# Patient Record
Sex: Female | Born: 1990 | Race: White | Hispanic: No | Marital: Single | Smoking: Current every day smoker
Health system: Southern US, Community
[De-identification: ages and names within clinical notes are randomized; demographics above are authoritative.]

## PROBLEM LIST (undated history)

## (undated) DIAGNOSIS — I38 Endocarditis, valve unspecified: Secondary | ICD-10-CM

## (undated) DIAGNOSIS — D649 Anemia, unspecified: Secondary | ICD-10-CM

## (undated) DIAGNOSIS — I509 Heart failure, unspecified: Secondary | ICD-10-CM

## (undated) DIAGNOSIS — R51 Headache: Secondary | ICD-10-CM

## (undated) DIAGNOSIS — N186 End stage renal disease: Secondary | ICD-10-CM

## (undated) DIAGNOSIS — N028 Recurrent and persistent hematuria with other morphologic changes: Secondary | ICD-10-CM

## (undated) DIAGNOSIS — R1310 Dysphagia, unspecified: Secondary | ICD-10-CM

## (undated) DIAGNOSIS — Z9981 Dependence on supplemental oxygen: Secondary | ICD-10-CM

## (undated) DIAGNOSIS — Z992 Dependence on renal dialysis: Secondary | ICD-10-CM

## (undated) DIAGNOSIS — G894 Chronic pain syndrome: Secondary | ICD-10-CM

## (undated) DIAGNOSIS — R768 Other specified abnormal immunological findings in serum: Secondary | ICD-10-CM

## (undated) DIAGNOSIS — Z7901 Long term (current) use of anticoagulants: Secondary | ICD-10-CM

## (undated) DIAGNOSIS — E781 Pure hyperglyceridemia: Secondary | ICD-10-CM

## (undated) DIAGNOSIS — R188 Other ascites: Secondary | ICD-10-CM

## (undated) DIAGNOSIS — E669 Obesity, unspecified: Secondary | ICD-10-CM

## (undated) DIAGNOSIS — N02B9 Other recurrent and persistent immunoglobulin A nephropathy: Secondary | ICD-10-CM

## (undated) DIAGNOSIS — F1990 Other psychoactive substance use, unspecified, uncomplicated: Secondary | ICD-10-CM

## (undated) DIAGNOSIS — R519 Headache, unspecified: Secondary | ICD-10-CM

## (undated) DIAGNOSIS — R7689 Other specified abnormal immunological findings in serum: Secondary | ICD-10-CM

## (undated) DIAGNOSIS — F419 Anxiety disorder, unspecified: Secondary | ICD-10-CM

## (undated) DIAGNOSIS — I071 Rheumatic tricuspid insufficiency: Secondary | ICD-10-CM

## (undated) DIAGNOSIS — Z79899 Other long term (current) drug therapy: Secondary | ICD-10-CM

## (undated) DIAGNOSIS — R609 Edema, unspecified: Secondary | ICD-10-CM

## (undated) DIAGNOSIS — R7881 Bacteremia: Secondary | ICD-10-CM

## (undated) DIAGNOSIS — I10 Essential (primary) hypertension: Secondary | ICD-10-CM

## (undated) HISTORY — DX: Obesity, unspecified: E66.9

## (undated) HISTORY — DX: Dependence on renal dialysis: Z99.2

## (undated) HISTORY — DX: Other recurrent and persistent immunoglobulin A nephropathy: N02.B9

## (undated) HISTORY — DX: Anemia, unspecified: D64.9

## (undated) HISTORY — DX: Essential (primary) hypertension: I10

## (undated) HISTORY — DX: Other specified abnormal immunological findings in serum: R76.8

## (undated) HISTORY — DX: Other specified abnormal immunological findings in serum: R76.89

## (undated) HISTORY — DX: Pure hyperglyceridemia: E78.1

## (undated) HISTORY — DX: Recurrent and persistent hematuria with other morphologic changes: N02.8

## (undated) HISTORY — PX: MITRAL VALVE REPLACEMENT: SHX147

---

## 2011-11-25 ENCOUNTER — Encounter: Payer: Self-pay | Admitting: Vascular Surgery

## 2011-11-26 ENCOUNTER — Ambulatory Visit: Payer: Self-pay | Admitting: Vascular Surgery

## 2011-11-26 ENCOUNTER — Encounter: Payer: Self-pay | Admitting: Vascular Surgery

## 2011-11-27 ENCOUNTER — Ambulatory Visit: Payer: Self-pay | Admitting: Vascular Surgery

## 2011-12-02 ENCOUNTER — Ambulatory Visit: Payer: Self-pay | Admitting: Vascular Surgery

## 2011-12-02 LAB — HCG, QUANTITATIVE, PREGNANCY: Beta Hcg, Quant.: <1 m[IU]/mL — ABNORMAL LOW

## 2011-12-02 LAB — POTASSIUM: Potassium: 4.4 mmol/L (ref 3.5–5.1)

## 2011-12-11 ENCOUNTER — Ambulatory Visit: Payer: Self-pay | Admitting: Vascular Surgery

## 2011-12-11 DIAGNOSIS — I10 Essential (primary) hypertension: Secondary | ICD-10-CM

## 2011-12-11 LAB — BASIC METABOLIC PANEL
Calcium, Total: 9.5 mg/dL (ref 8.5–10.1)
Co2: 26 mmol/L (ref 21–32)
Creatinine: 6.62 mg/dL — ABNORMAL HIGH (ref 0.60–1.30)
EGFR (African American): 9 — ABNORMAL LOW
EGFR (Non-African Amer.): 8 — ABNORMAL LOW
Glucose: 80 mg/dL (ref 65–99)
Osmolality: 283 (ref 275–301)
Sodium: 140 mmol/L (ref 136–145)

## 2011-12-11 LAB — CBC
MCH: 32.8 pg (ref 26.0–34.0)
MCHC: 33.8 g/dL (ref 32.0–36.0)
Platelet: 354 10*3/uL (ref 150–440)
RBC: 3.68 10*6/uL — ABNORMAL LOW (ref 3.80–5.20)
RDW: 14.1 % (ref 11.5–14.5)

## 2011-12-11 LAB — PREGNANCY, URINE: Pregnancy Test, Urine: NEGATIVE m[IU]/mL

## 2012-01-09 ENCOUNTER — Ambulatory Visit: Payer: Self-pay | Admitting: Vascular Surgery

## 2014-09-06 NOTE — Op Note (Signed)
PATIENT NAME:  Marilyn Hanson, Marilyn Hanson MR#:  [id number] DATE OF BIRTH:  1990/11/06  DATE OF PROCEDURE:  12/11/2011  PREOPERATIVE DIAGNOSES:  1. End-stage renal disease.  2. Hypertension.   POSTOPERATIVE DIAGNOSES:  1. End-stage renal disease.  2. Hypertension.   PROCEDURE PERFORMED: Laparoscopic insertion of peritoneal dialysis catheter.   SURGEON: Annice NeedyJason S. Simrit Gohlke, M.D.   ANESTHESIA: General.   ESTIMATED BLOOD LOSS: Minimal.   INDICATION FOR PROCEDURE: This is a 24 year old white female with end-stage renal disease. She has elected to proceed with peritoneal dialysis. Risks and benefits were discussed for peritoneal dialysis catheter and informed consent was obtained.   DESCRIPTION OF PROCEDURE: The patient was brought to the operative suite and after an adequate level of general anesthesia was obtained the abdomen was sterilely prepped and draped and a sterile surgical field was created. A small transverse incision was created to left of the umbilicus and we dissected down to the fascia, a pursestring suture was created with a 2-0 Vicryl suture, and I then created a transverse incision in the right upper quadrant and placed an Optiview 10 mm trocar and under direct visualization entering the abdomen insufflating this with carbon dioxide. The swan-neck peritoneal dialysis catheter was then placed into the pelvis through the middle of the pursestring in the fascia. It was parked into the pelvis. The cuff was secured to the fascia with a 2-0 Vicryl suture and a second 2-0 Vicryl pursestring suture was placed. The catheter was then brought out in the left lower quadrant with a separate stab incision with the second cuff being between the two incisions. The abdomen was desufflated of carbon dioxide. We instilled approximately 500 mL of sterile saline and 200 to 250 mL of saline immediately returned. We then visualized the catheter in the pelvis on removal of the laparoscope, with the abdomen desufflated, removing  the 10 mm trocar. Both incisions were closed with 3-0 Vicryl and a 4-0 Monocryl. Dermabond was placed over these dressing  and a dry dressing was placed around the catheter exit site. Please note the titanium connector for the peritoneal dialysis catheter from the company [employer] was used. ____________________________ Annice NeedyJason S. Berdia Lachman, MD jsd:slb D: 12/11/2011 15:46:59 ET T: 12/11/2011 15:59:35 ET JOB#: [id number]  cc: Annice NeedyJason S. Andre Gallego, MD, <Dictator> Salomon MastBelayenh Befekadu, MD Annice NeedyJASON S Carynn Felling MD ELECTRONICALLY SIGNED 12/12/2011 10:59

## 2014-09-06 NOTE — Op Note (Signed)
PATIENT NAME:  Marilyn Hanson, Marilyn Hanson MR#:  [id number] DATE OF BIRTH:  1991/02/09  DATE OF PROCEDURE:  12/02/2011  PREOPERATIVE DIAGNOSES:  1. End-stage renal disease.  2. Occluded left arm AV fistula.  3. Hypertension.   POSTOPERATIVE DIAGNOSES:    1. End-stage renal disease.  2. Occluded left arm AV fistula.  3. Hypertension.   PROCEDURES:  1. Ultrasound guidance for vascular access to left radiocephalic AV fistula.  2. Left upper extremity fistulogram.  3. Percutaneous transluminal angioplasty of left radiocephalic anastomosis with 2 and 4 mm diameter angioplasty balloon.   SURGEON: Annice Needy, M.D.   ANESTHESIA: Local with moderate conscious sedation.   ESTIMATED BLOOD LOSS: 25 mL. CONTRAST USED: 35 mL of Visipaque. FLUOROSCOPY TIME:  Approximately 10 minutes.   INDICATION FOR PROCEDURE: This is a 24 year old white female who is reasonably new to dialysis. She is already on her second PermCath. She had a left radiocephalic AV fistula created at [facility] about a month ago. This has been occluded for approximately one and one-half to two weeks. I had discussed with her in the office and she had desired to undergo peritoneal dialysis. Her dialysis center then contacted our office several days later and requested an attempt at declotting the fistula. It was discussed with the patient that this was unlikely to be usable before her peritoneal dialysis would be usable, but if that was her and their desire, we would attempt to do so although it had been occluded for some time. Informed consent was obtained.   DESCRIPTION OF PROCEDURE: The patient was brought to the vascular interventional radiology suite. Left upper extremity was sterilely prepped and draped and a sterile surgical field was created. The cephalic vein in the mid forearm was accessed in a retrograde fashion under direct ultrasound guidance with a micropuncture needle. Micropuncture wire and sheath were then placed. I upsized to a 6 [place]  sheath. Imaging showed a clear occlusion of the fistula with multiple cephalic vein branches. It was difficult to tell where the anastomosis was except for the anasta clips that marked the area of the anastomosis. This was occluded. With a moderate amount of difficulty. I was able to get a stiff angled Glidewire and glide catheter into the radial artery past the anastomosis. This was with some difficulty so I exchanged for an 0.018 wire. Initially, a 2 mm diameter angioplasty balloon was inflated and then after this, I was able to get a 4 mm diameter angioplasty balloon across this. There was an extremely tight waste taken just at and beyond the anastomosis with the 4 mm balloon that opened at 14 atmospheres. I then brought the balloon up the cephalic vein to balloon this area. Following angioplasty, the fistula was now patent, but the vein was still quite small. The patient had very poor pain tolerance throughout the procedure and really did not tolerate the procedure well and did not want to have anymore painful procedure such as angioplasty to a larger size performed. I think it is very unlikely this fistula would be mature within the next 2 to 3 months and by that time she should already be on peritoneal dialysis anyway and so at this point, although we had gotten the fistula opened, it was quite small, but I elected to terminate the procedure. The sheath was removed. Pressure was held. Sterile dressing was placed. The patient was taken to the recovery room in stable condition.   ____________________________ Annice Needy, MD jsd:ap D: 12/02/2011 16:21:01 ET T:  12/02/2011 17:21:50 ET JOB#: [id number]  cc: Annice NeedyJason S. Dew, MD, <Dictator> Dr. Dorothea OgleBethacadu, Nephrologist  Annice NeedyJASON S DEW MD ELECTRONICALLY SIGNED 12/04/2011 12:30

## 2017-02-13 ENCOUNTER — Encounter (HOSPITAL_COMMUNITY): Payer: Self-pay | Admitting: *Deleted

## 2017-02-13 ENCOUNTER — Emergency Department (HOSPITAL_COMMUNITY)
Admission: EM | Admit: 2017-02-13 | Discharge: 2017-02-13 | Disposition: A | Discharge status: Home / Self Care | Payer: Medicare Other | Attending: Emergency Medicine | Admitting: Emergency Medicine

## 2017-02-13 DIAGNOSIS — Z992 Dependence on renal dialysis: Secondary | ICD-10-CM | POA: Diagnosis not present

## 2017-02-13 DIAGNOSIS — N186 End stage renal disease: Secondary | ICD-10-CM | POA: Insufficient documentation

## 2017-02-13 DIAGNOSIS — I120 Hypertensive chronic kidney disease with stage 5 chronic kidney disease or end stage renal disease: Secondary | ICD-10-CM | POA: Insufficient documentation

## 2017-02-13 DIAGNOSIS — F17210 Nicotine dependence, cigarettes, uncomplicated: Secondary | ICD-10-CM | POA: Insufficient documentation

## 2017-02-13 DIAGNOSIS — R002 Palpitations: Secondary | ICD-10-CM | POA: Diagnosis present

## 2017-02-13 DIAGNOSIS — Z79899 Other long term (current) drug therapy: Secondary | ICD-10-CM | POA: Insufficient documentation

## 2017-02-13 DIAGNOSIS — I471 Supraventricular tachycardia: Secondary | ICD-10-CM | POA: Insufficient documentation

## 2017-02-13 HISTORY — DX: Anxiety disorder, unspecified: F41.9

## 2017-02-13 LAB — CBC WITH DIFFERENTIAL/PLATELET
Basophils Absolute: 0 10*3/uL (ref 0.0–0.1)
Basophils Relative: 0 %
Eosinophils Absolute: 0.3 10*3/uL (ref 0.0–0.7)
Eosinophils Relative: 3 %
HCT: 35.9 % — ABNORMAL LOW (ref 36.0–46.0)
Hemoglobin: 11.3 g/dL — ABNORMAL LOW (ref 12.0–15.0)
Lymphocytes Relative: 10 %
Lymphs Abs: 0.9 10*3/uL (ref 0.7–4.0)
MCH: 31.9 pg (ref 26.0–34.0)
MCHC: 31.5 g/dL (ref 30.0–36.0)
MCV: 101.4 fL — ABNORMAL HIGH (ref 78.0–100.0)
Monocytes Absolute: 0.3 10*3/uL (ref 0.1–1.0)
Monocytes Relative: 3 %
Neutro Abs: 8 10*3/uL — ABNORMAL HIGH (ref 1.7–7.7)
Neutrophils Relative %: 84 %
Platelets: 192 10*3/uL (ref 150–400)
RBC: 3.54 MIL/uL — ABNORMAL LOW (ref 3.87–5.11)
RDW: 16.7 % — ABNORMAL HIGH (ref 11.5–15.5)
WBC: 9.5 10*3/uL (ref 4.0–10.5)

## 2017-02-13 LAB — PROTIME-INR
INR: 1.65
Prothrombin Time: 19.4 seconds — ABNORMAL HIGH (ref 11.4–15.2)

## 2017-02-13 LAB — BASIC METABOLIC PANEL
Anion gap: 10 (ref 5–15)
BUN: 23 mg/dL — ABNORMAL HIGH (ref 6–20)
CO2: 31 mmol/L (ref 22–32)
Calcium: 9 mg/dL (ref 8.9–10.3)
Chloride: 94 mmol/L — ABNORMAL LOW (ref 101–111)
Creatinine, Ser: 4.21 mg/dL — ABNORMAL HIGH (ref 0.44–1.00)
GFR calc Af Amer: 16 mL/min — ABNORMAL LOW (ref >60)
GFR calc non Af Amer: 14 mL/min — ABNORMAL LOW (ref >60)
Glucose, Bld: 123 mg/dL — ABNORMAL HIGH (ref 65–99)
Potassium: 4.1 mmol/L (ref 3.5–5.1)
Sodium: 135 mmol/L (ref 135–145)

## 2017-02-13 LAB — TROPONIN I: Troponin I: 0.03 ng/mL (ref <0.03)

## 2017-02-13 LAB — MAGNESIUM: Magnesium: 2 mg/dL (ref 1.7–2.4)

## 2017-02-13 LAB — HCG, SERUM, QUALITATIVE: Preg, Serum: NEGATIVE

## 2017-02-13 MED ORDER — ADENOSINE 6 MG/2ML IV SOLN
INTRAVENOUS | Status: AC
  Filled 2017-02-13: qty 2

## 2017-02-13 MED ORDER — METOPROLOL TARTRATE 5 MG/5ML IV SOLN
5.0000 mg | Freq: Once | INTRAVENOUS | Status: AC
  Administered 2017-02-13: 5 mg via INTRAVENOUS
  Filled 2017-02-13: qty 5

## 2017-02-13 MED ORDER — METOPROLOL TARTRATE 50 MG PO TABS: 50.0000 mg | ORAL_TABLET | Freq: Two times a day (BID) | ORAL | 0 refills | Status: AC

## 2017-02-13 MED ORDER — ADENOSINE 6 MG/2ML IV SOLN
6.0000 mg | Freq: Once | INTRAVENOUS | Status: AC
  Administered 2017-02-13: 6 mg via INTRAVENOUS

## 2017-02-13 MED ORDER — LORAZEPAM 2 MG/ML IJ SOLN
1.0000 mg | Freq: Once | INTRAMUSCULAR | Status: AC
  Administered 2017-02-13: 1 mg via INTRAVENOUS
  Filled 2017-02-13: qty 1

## 2017-02-13 MED ORDER — ADENOSINE 6 MG/2ML IV SOLN
6.0000 mg | Freq: Once | INTRAVENOUS | Status: AC
  Administered 2017-02-13: 6 mg via INTRAVENOUS
  Filled 2017-02-13: qty 2

## 2017-02-13 MED ORDER — ENOXAPARIN SODIUM 80 MG/0.8ML ~~LOC~~ SOLN
1.0000 mg/kg | Freq: Once | SUBCUTANEOUS | Status: AC
Start: 2017-02-13 — End: 2017-02-13
  Administered 2017-02-13: 75 mg via SUBCUTANEOUS
  Filled 2017-02-13: qty 0.8

## 2017-02-13 MED ORDER — ADENOSINE 6 MG/2ML IV SOLN
12.0000 mg | Freq: Once | INTRAVENOUS | Status: AC
  Administered 2017-02-13: 12 mg via INTRAVENOUS

## 2017-02-13 NOTE — Discharge Instructions (Signed)
You need to follow-up with your cardiologist. Call to make an appointment as soon as you can. Increase warfarin for the next few days as discussed and have INR checked again next week. You are getting a prescription for metoprolol  twice a day. You have been prescribed this previously.

## 2017-02-13 NOTE — ED Notes (Signed)
Dialysis nurse in room to deaccess dialysis site which was left in when EMS transported pt from dialysis center to here.

## 2017-02-13 NOTE — ED Notes (Signed)
Attempted x1 for IV access without success  

## 2017-02-13 NOTE — ED Provider Notes (Signed)
[facility]-EMERGENCY DEPT Provider Note   CSN: [id number] Arrival date & time: 02/13/17  1501     History   Chief Complaint Chief Complaint  Patient presents with  . Palpitations    HPI Marilyn Hanson is a 26 y.o. female.  HPI   25yF with palpitations. 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   Onset of palpitations about an hour prior to arrival while at dialysis. Feels like her heart is racing. Says she was in usual state of health prior to this. Reports dialysis was stopped about 30 minutes early. Feels hot and like she cannot breath. Chronic pain, denies anything acute. Reports has cardiologist in [place], [place] but cannot remember name. From her description it sound like she had prior cardioversion.   Past Medical History:  Diagnosis Date  . Anemia   . Anxiety   . Hemodialysis patient (HCC)   . Hypertension   . Hypertriglyceridemia   . IgA nephropathy   . Obesity (BMI 30-39.9)   . Positive hepatitis C antibody test     There are no active problems to display for this patient.   History reviewed. No pertinent surgical history.  OB History    No data available       Home Medications    Prior to Admission medications   Medication Sig Start Date End Date Taking? Authorizing Provider  carvedilol (COREG) 6.25 MG tablet Take 6.25 mg by mouth 2 (two) times daily with a meal.    [provider]  clonazePAM (KLONOPIN) 1 MG tablet Take 1 mg by mouth 2 (two) times daily as needed.    [provider]  epoetin alfa (EPOGEN,PROCRIT) 2000 UNIT/ML injection Inject 2,000 Units into the skin 3 (three) times a week.    [provider]  fish oil-omega-3 fatty acids 1000 MG capsule Take 1 g by mouth daily.    [provider]  furosemide (LASIX) 40  MG tablet Take 40 mg by mouth 2 (two) times daily.    [provider]  gabapentin (NEURONTIN) 100 MG capsule Take 100 mg by mouth at bedtime.    [provider]  NIFEdipine (PROCARDIA XL/ADALAT-CC) 60 MG 24 hr tablet Take 60 mg by mouth daily.    [provider]  polyethylene glycol (MIRALAX / GLYCOLAX) packet Take 17 g by mouth daily.    [provider]  sevelamer (RENAGEL) 800 MG tablet Take 800 mg by mouth 3 (three) times daily with meals.    [provider]    Family History No family history on file.  Social History Social History  Substance Use Topics  . Smoking status: Current Every Day Smoker    Packs/day: 0.50  . Smokeless tobacco: Never Used  . Alcohol use No     Allergies   Asa [aspirin]; Ibuprofen; and Penicillins   Review of Systems Review of Systems  All systems reviewed and negative, other than as noted in HPI.   Physical Exam Updated Vital Signs BP (!) 145/92 (BP Location: Right Arm)   Pulse (!) 115   Temp 98.6 F (37 C) (Oral)   Resp 20   Ht  (1.676 m)   Wt 76.5 kg (168 lb 10.4 oz)   LMP  (LMP Unknown)   SpO2 100%   BMI 27.22 kg/m   Physical Exam  Constitutional: She appears well-developed  and well-nourished. No distress.  Sitting in bed. Drowsy. Speech slow/slurred but understandable. No eye contact.  Marland Kitchen   HENT:  Head: Normocephalic and atraumatic.  Eyes: Conjunctivae are normal. Right eye exhibits no discharge. Left eye exhibits no discharge.  Neck: Neck supple.  Cardiovascular: Regular rhythm and normal heart sounds.  Exam reveals no gallop and no friction rub.   No murmur heard. Mild tachycardia. Sternotomy scar. Mechanical heart sounds. HD cannulas still in place LUE.   Pulmonary/Chest: Effort normal and breath sounds normal. No respiratory distress.  Abdominal: Soft. She exhibits no distension. There is no tenderness.  Mild mid/lower abdominal tenderness  Musculoskeletal: She exhibits  edema. She exhibits no tenderness.  Symmetric LE edema. Chronic appearing skin changes to b/l LE consistent with venous stasis.   Neurological: She is alert.  Skin: Skin is warm and dry.  Psychiatric: She has a normal mood and affect. Her behavior is normal. Thought content normal.  Nursing note and vitals reviewed.    ED Treatments / Results  Labs (all labs ordered are listed, but only abnormal results are displayed) Labs Reviewed  CBC WITH DIFFERENTIAL/PLATELET - Abnormal; Notable for the following:       Result Value   RBC 3.54 (*)    Hemoglobin 11.3 (*)    HCT 35.9 (*)    MCV 101.4 (*)    RDW 16.7 (*)    Neutro Abs 8.0 (*)    All other components within normal limits  BASIC METABOLIC PANEL  PROTIME-INR  MAGNESIUM  HCG, SERUM, QUALITATIVE  TROPONIN I    EKG  EKG Interpretation  Date/Time:  Thursday February 13 2017 15:28:19 EDT Ventricular Rate:  111 PR Interval:    QRS Duration: 98 QT Interval:  333 QTC Calculation: 453 R Axis:   88 Text Interpretation:  Junctional tachycardia? Borderline T abnormalities, diffuse leads Confirmed by Raeford Razor [phone]) on 02/13/2017 3:47:01 PM       Radiology No results found.  Procedures Procedures (including critical care time)  Medications Ordered in ED Medications - No data to display   Initial Impression / Assessment and Plan / ED Course  I have reviewed the triage vital signs and the nursing notes.  Pertinent labs & imaging results that were available during my care of the patient were reviewed by me and considered in my medical decision making (see chart for details).     25yF with palpitations. EKG with regular narrow complex tachycardia. Rate ~100-110.  Occasionally to increasing to 130s but remaining very regular w/o  She is symptomatic but otherwise seems stable. She is drowsy but answers all questions appropriately.  BP fine. Says she feels short of breath but no increased WOB, o2 sats 100% and lungs  clear. Hx of "atrial tachyarrhythmia" but I'm not sure of specifics beyond this.   For whatever reason, she is not very cooperative. (Wasn't willing to even try vagal maneuvers. Kept saying she was hot but insisted on wearing a taboggan.   Previously appears to have been on coreg, 6.25 mg BID and nifedipine 60 mg daily at one point. Admission note at [facility] in April of this year lists metoprolol 50 mg BID. Pt is not sure of what meds she takes specifically. Mother seems more knowledgeable and she thinks these medications were stopped will at [facility] although she is not sure why.   At this point, will wait for labs to be resulted particularly with hx of ESRD. Will address any significant electrolyte abnormalities if present.  May end up giving adenosine or cardizem.  DC cardioversion an option if INR therapeutic. It sounds like she may have been cardioverted previously.   She was given adenosine 6,6,12 w/o apparent effect. She actually converted subsequently to sinus rhythm. Will restart on metoprolol. Needs to FU with her cardiologist.     Final Clinical Impressions(s) / ED Diagnoses   Final diagnoses:  Palpitations  Junctional tachycardia (HCC)    New Prescriptions Discharge Medication List as of 02/13/2017  6:49 PM    START taking these medications   Details  metoprolol tartrate (LOPRESSOR) 50 MG tablet Take 1 tablet (50 mg total) by mouth 2 (two) times daily., Starting Thu 02/13/2017, Print         Raeford Razor, MD 02/26/17 [phone]

## 2017-02-13 NOTE — ED Notes (Signed)
CRITICAL VALUE ALERT  Critical Value:  Trop 0.03  Date & Time Notied:  02/13/17 1648  Provider Notified: bd, rn  Orders Received/Actions taken: Dr. Juleen China notified

## 2017-10-18 DIAGNOSIS — F1990 Other psychoactive substance use, unspecified, uncomplicated: Secondary | ICD-10-CM

## 2017-10-18 DIAGNOSIS — I38 Endocarditis, valve unspecified: Secondary | ICD-10-CM

## 2017-10-18 DIAGNOSIS — B951 Streptococcus, group B, as the cause of diseases classified elsewhere: Secondary | ICD-10-CM

## 2017-10-18 DIAGNOSIS — R7881 Bacteremia: Secondary | ICD-10-CM

## 2017-10-18 HISTORY — DX: Streptococcus, group b, as the cause of diseases classified elsewhere: B95.1

## 2017-10-18 HISTORY — DX: Other psychoactive substance use, unspecified, uncomplicated: F19.90

## 2017-10-18 HISTORY — DX: Endocarditis, valve unspecified: I38

## 2017-10-18 HISTORY — DX: Bacteremia: R78.81

## 2017-10-31 MED ORDER — GENTAMICIN IN SALINE 1.6-0.9 MG/ML-% IV SOLN
80.00 | INTRAVENOUS | Status: DC
Start: ? — End: 2017-10-31

## 2017-10-31 MED ORDER — OXYCODONE HCL ER 10 MG PO T12A
10.00 | EXTENDED_RELEASE_TABLET | ORAL | Status: DC
Start: 2017-10-31 — End: 2017-10-31

## 2017-10-31 MED ORDER — HYDROMORPHONE HCL 2 MG PO TABS
1.00 | ORAL_TABLET | ORAL | Status: DC
Start: ? — End: 2017-10-31

## 2017-10-31 MED ORDER — DIPHENHYDRAMINE HCL 25 MG PO CAPS
50.00 | ORAL_CAPSULE | ORAL | Status: DC
Start: ? — End: 2017-10-31

## 2017-10-31 MED ORDER — GENERIC EXTERNAL MEDICATION
Status: DC
Start: ? — End: 2017-10-31

## 2017-10-31 MED ORDER — NALOXONE HCL 0.4 MG/ML IJ SOLN
0.40 | INTRAMUSCULAR | Status: DC
Start: ? — End: 2017-10-31

## 2017-10-31 MED ORDER — VANCOMYCIN HCL 50 MG/ML PO SOLR
125.00 | ORAL | Status: DC
Start: 2017-10-31 — End: 2017-10-31

## 2017-10-31 MED ORDER — SEVELAMER CARBONATE 0.8 G PO PACK
1600.00 | PACK | ORAL | Status: DC
Start: 2017-10-31 — End: 2017-10-31

## 2017-10-31 MED ORDER — HEPARIN (PORCINE) IN NACL 100-0.45 UNIT/ML-% IJ SOLN
1950.00 | INTRAMUSCULAR | Status: DC
Start: ? — End: 2017-10-31

## 2017-10-31 MED ORDER — BISACODYL 10 MG RE SUPP
10.00 | RECTAL | Status: DC
Start: ? — End: 2017-10-31

## 2017-10-31 MED ORDER — GENERIC EXTERNAL MEDICATION
2.00 | Status: DC
Start: 2017-10-31 — End: 2017-10-31

## 2017-10-31 MED ORDER — ONDANSETRON HCL 4 MG/2ML IJ SOLN
4.00 | INTRAMUSCULAR | Status: DC
Start: ? — End: 2017-10-31

## 2017-10-31 MED ORDER — LACTULOSE 10 GM/15ML PO SOLN
30.00 | ORAL | Status: DC
Start: 2017-10-31 — End: 2017-10-31

## 2017-10-31 MED ORDER — RIFAXIMIN 550 MG PO TABS
550.00 | ORAL_TABLET | ORAL | Status: DC
Start: 2017-10-31 — End: 2017-10-31

## 2017-10-31 MED ORDER — LORAZEPAM 2 MG/ML IJ SOLN
1.00 | INTRAMUSCULAR | Status: DC
Start: ? — End: 2017-10-31

## 2017-10-31 MED ORDER — LORAZEPAM 1 MG PO TABS
.50 | ORAL_TABLET | ORAL | Status: DC
Start: ? — End: 2017-10-31

## 2017-10-31 MED ORDER — MELATONIN 3 MG PO TABS
3.00 | ORAL_TABLET | ORAL | Status: DC
Start: 2017-10-31 — End: 2017-10-31

## 2017-10-31 MED ORDER — CLONAZEPAM 0.5 MG PO TABS
0.25 | ORAL_TABLET | ORAL | Status: DC
Start: 2017-10-31 — End: 2017-10-31

## 2018-02-28 ENCOUNTER — Emergency Department (HOSPITAL_COMMUNITY): Payer: Medicare Other

## 2018-02-28 ENCOUNTER — Observation Stay (HOSPITAL_COMMUNITY)
Admission: EM | Admit: 2018-02-28 | Discharge: 2018-03-02 | Disposition: A | Discharge status: Home / Self Care | Payer: Medicare Other | Attending: Internal Medicine | Admitting: Internal Medicine

## 2018-02-28 ENCOUNTER — Encounter: Payer: Self-pay | Admitting: Emergency Medicine

## 2018-02-28 DIAGNOSIS — F17210 Nicotine dependence, cigarettes, uncomplicated: Secondary | ICD-10-CM | POA: Diagnosis not present

## 2018-02-28 DIAGNOSIS — K760 Fatty (change of) liver, not elsewhere classified: Secondary | ICD-10-CM | POA: Diagnosis not present

## 2018-02-28 DIAGNOSIS — D631 Anemia in chronic kidney disease: Secondary | ICD-10-CM | POA: Diagnosis not present

## 2018-02-28 DIAGNOSIS — I10 Essential (primary) hypertension: Secondary | ICD-10-CM | POA: Diagnosis not present

## 2018-02-28 DIAGNOSIS — Z882 Allergy status to sulfonamides status: Secondary | ICD-10-CM | POA: Insufficient documentation

## 2018-02-28 DIAGNOSIS — K8689 Other specified diseases of pancreas: Secondary | ICD-10-CM | POA: Insufficient documentation

## 2018-02-28 DIAGNOSIS — G9340 Encephalopathy, unspecified: Secondary | ICD-10-CM | POA: Diagnosis present

## 2018-02-28 DIAGNOSIS — I639 Cerebral infarction, unspecified: Secondary | ICD-10-CM | POA: Insufficient documentation

## 2018-02-28 DIAGNOSIS — R5383 Other fatigue: Secondary | ICD-10-CM | POA: Diagnosis present

## 2018-02-28 DIAGNOSIS — G92 Toxic encephalopathy: Principal | ICD-10-CM | POA: Insufficient documentation

## 2018-02-28 DIAGNOSIS — N028 Recurrent and persistent hematuria with other morphologic changes: Secondary | ICD-10-CM | POA: Diagnosis present

## 2018-02-28 DIAGNOSIS — Z79899 Other long term (current) drug therapy: Secondary | ICD-10-CM | POA: Diagnosis not present

## 2018-02-28 DIAGNOSIS — E872 Acidosis, unspecified: Secondary | ICD-10-CM | POA: Diagnosis present

## 2018-02-28 DIAGNOSIS — I509 Heart failure, unspecified: Secondary | ICD-10-CM

## 2018-02-28 DIAGNOSIS — Z992 Dependence on renal dialysis: Secondary | ICD-10-CM

## 2018-02-28 DIAGNOSIS — I132 Hypertensive heart and chronic kidney disease with heart failure and with stage 5 chronic kidney disease, or end stage renal disease: Secondary | ICD-10-CM | POA: Insufficient documentation

## 2018-02-28 DIAGNOSIS — R401 Stupor: Secondary | ICD-10-CM

## 2018-02-28 DIAGNOSIS — E669 Obesity, unspecified: Secondary | ICD-10-CM | POA: Insufficient documentation

## 2018-02-28 DIAGNOSIS — Z952 Presence of prosthetic heart valve: Secondary | ICD-10-CM | POA: Diagnosis not present

## 2018-02-28 DIAGNOSIS — Z91030 Bee allergy status: Secondary | ICD-10-CM | POA: Insufficient documentation

## 2018-02-28 DIAGNOSIS — R188 Other ascites: Secondary | ICD-10-CM | POA: Diagnosis not present

## 2018-02-28 DIAGNOSIS — N186 End stage renal disease: Secondary | ICD-10-CM | POA: Diagnosis not present

## 2018-02-28 DIAGNOSIS — Z7901 Long term (current) use of anticoagulants: Secondary | ICD-10-CM

## 2018-02-28 DIAGNOSIS — Z87892 Personal history of anaphylaxis: Secondary | ICD-10-CM | POA: Insufficient documentation

## 2018-02-28 DIAGNOSIS — Z886 Allergy status to analgesic agent status: Secondary | ICD-10-CM | POA: Insufficient documentation

## 2018-02-28 DIAGNOSIS — Z888 Allergy status to other drugs, medicaments and biological substances status: Secondary | ICD-10-CM | POA: Insufficient documentation

## 2018-02-28 DIAGNOSIS — K7290 Hepatic failure, unspecified without coma: Secondary | ICD-10-CM | POA: Insufficient documentation

## 2018-02-28 DIAGNOSIS — Z9981 Dependence on supplemental oxygen: Secondary | ICD-10-CM | POA: Diagnosis not present

## 2018-02-28 DIAGNOSIS — B1920 Unspecified viral hepatitis C without hepatic coma: Secondary | ICD-10-CM | POA: Diagnosis not present

## 2018-02-28 DIAGNOSIS — R768 Other specified abnormal immunological findings in serum: Secondary | ICD-10-CM

## 2018-02-28 DIAGNOSIS — Z6831 Body mass index (BMI) 31.0-31.9, adult: Secondary | ICD-10-CM | POA: Diagnosis not present

## 2018-02-28 DIAGNOSIS — F419 Anxiety disorder, unspecified: Secondary | ICD-10-CM | POA: Insufficient documentation

## 2018-02-28 DIAGNOSIS — Z880 Allergy status to penicillin: Secondary | ICD-10-CM | POA: Diagnosis not present

## 2018-02-28 DIAGNOSIS — I071 Rheumatic tricuspid insufficiency: Secondary | ICD-10-CM

## 2018-02-28 DIAGNOSIS — G894 Chronic pain syndrome: Secondary | ICD-10-CM | POA: Insufficient documentation

## 2018-02-28 DIAGNOSIS — N02B9 Other recurrent and persistent immunoglobulin A nephropathy: Secondary | ICD-10-CM

## 2018-02-28 DIAGNOSIS — R17 Unspecified jaundice: Secondary | ICD-10-CM

## 2018-02-28 DIAGNOSIS — J90 Pleural effusion, not elsewhere classified: Secondary | ICD-10-CM | POA: Diagnosis not present

## 2018-02-28 HISTORY — DX: Chronic pain syndrome: G89.4

## 2018-02-28 HISTORY — DX: Rheumatic tricuspid insufficiency: I07.1

## 2018-02-28 HISTORY — DX: Other long term (current) drug therapy: Z79.899

## 2018-02-28 HISTORY — DX: Endocarditis, valve unspecified: I38

## 2018-02-28 HISTORY — DX: Other psychoactive substance use, unspecified, uncomplicated: F19.90

## 2018-02-28 HISTORY — DX: Dependence on renal dialysis: Z99.2

## 2018-02-28 HISTORY — DX: Headache: R51

## 2018-02-28 HISTORY — DX: Edema, unspecified: R60.9

## 2018-02-28 HISTORY — DX: Dependence on supplemental oxygen: Z99.81

## 2018-02-28 HISTORY — DX: Bacteremia: R78.81

## 2018-02-28 HISTORY — DX: Other ascites: R18.8

## 2018-02-28 HISTORY — DX: Long term (current) use of anticoagulants: Z79.01

## 2018-02-28 HISTORY — DX: Headache, unspecified: R51.9

## 2018-02-28 HISTORY — DX: Dysphagia, unspecified: R13.10

## 2018-02-28 HISTORY — DX: Heart failure, unspecified: I50.9

## 2018-02-28 HISTORY — DX: End stage renal disease: N18.6

## 2018-02-28 LAB — AMMONIA: Ammonia: 90 umol/L — ABNORMAL HIGH (ref 9–35)

## 2018-02-28 LAB — LIPASE, BLOOD: Lipase: 31 U/L (ref 11–51)

## 2018-02-28 LAB — COMPREHENSIVE METABOLIC PANEL
ALBUMIN: 3.7 g/dL (ref 3.5–5.0)
ALT: 34 U/L (ref 0–44)
AST: 50 U/L — AB (ref 15–41)
Alkaline Phosphatase: 318 U/L — ABNORMAL HIGH (ref 38–126)
Anion gap: 21 — ABNORMAL HIGH (ref 5–15)
BUN: 76 mg/dL — AB (ref 6–20)
CHLORIDE: 95 mmol/L — AB (ref 98–111)
CO2: 17 mmol/L — AB (ref 22–32)
CREATININE: 8.95 mg/dL — AB (ref 0.44–1.00)
Calcium: 8.7 mg/dL — ABNORMAL LOW (ref 8.9–10.3)
GFR calc Af Amer: 6 mL/min — ABNORMAL LOW (ref >60)
GFR calc non Af Amer: 5 mL/min — ABNORMAL LOW (ref >60)
Glucose, Bld: 99 mg/dL (ref 70–99)
POTASSIUM: 4.5 mmol/L (ref 3.5–5.1)
SODIUM: 133 mmol/L — AB (ref 135–145)
Total Bilirubin: 4.3 mg/dL — ABNORMAL HIGH (ref 0.3–1.2)
Total Protein: 8.8 g/dL — ABNORMAL HIGH (ref 6.5–8.1)

## 2018-02-28 LAB — CBC WITH DIFFERENTIAL/PLATELET
Abs Immature Granulocytes: 0.03 10*3/uL (ref 0.00–0.07)
BASOS PCT: 1 %
Basophils Absolute: 0.1 10*3/uL (ref 0.0–0.1)
EOS ABS: 0.2 10*3/uL (ref 0.0–0.5)
Eosinophils Relative: 5 %
HCT: 35.8 % — ABNORMAL LOW (ref 36.0–46.0)
Hemoglobin: 11.4 g/dL — ABNORMAL LOW (ref 12.0–15.0)
Immature Granulocytes: 1 %
Lymphocytes Relative: 23 %
Lymphs Abs: 1 10*3/uL (ref 0.7–4.0)
MCH: 32.8 pg (ref 26.0–34.0)
MCHC: 31.8 g/dL (ref 30.0–36.0)
MCV: 102.9 fL — AB (ref 80.0–100.0)
MONO ABS: 0.4 10*3/uL (ref 0.1–1.0)
Monocytes Relative: 10 %
Neutro Abs: 2.5 10*3/uL (ref 1.7–7.7)
Neutrophils Relative %: 60 %
PLATELETS: 120 10*3/uL — AB (ref 150–400)
RBC: 3.48 MIL/uL — AB (ref 3.87–5.11)
RDW: 16.2 % — AB (ref 11.5–15.5)
WBC: 4.2 10*3/uL (ref 4.0–10.5)
nRBC: 0 % (ref 0.0–0.2)

## 2018-02-28 LAB — SALICYLATE LEVEL: Salicylate Lvl: <7 mg/dL (ref 2.8–30.0)

## 2018-02-28 LAB — PROTIME-INR
INR: 3.92
PROTHROMBIN TIME: 38.1 s — AB (ref 11.4–15.2)

## 2018-02-28 LAB — I-STAT BETA HCG BLOOD, ED (MC, WL, AP ONLY)

## 2018-02-28 LAB — ACETAMINOPHEN LEVEL

## 2018-02-28 LAB — TROPONIN I: Troponin I: 0.07 ng/mL (ref <0.03)

## 2018-02-28 LAB — ETHANOL: Alcohol, Ethyl (B): <10 mg/dL (ref <10)

## 2018-02-28 LAB — LACTIC ACID, PLASMA
LACTIC ACID, VENOUS: 1.6 mmol/L (ref 0.5–1.9)
LACTIC ACID, VENOUS: 1.6 mmol/L (ref 0.5–1.9)

## 2018-02-28 MED ORDER — DIPHENHYDRAMINE HCL 25 MG PO CAPS: 25.0000 mg | ORAL_CAPSULE | Freq: Four times a day (QID) | ORAL | Status: DC | PRN

## 2018-02-28 MED ORDER — SEVELAMER CARBONATE 800 MG PO TABS
800.0000 mg | ORAL_TABLET | Freq: Three times a day (TID) | ORAL | Status: DC
  Administered 2018-03-01 – 2018-03-02 (×4): 800 mg via ORAL
  Filled 2018-02-28 (×4): qty 1

## 2018-02-28 MED ORDER — NALOXONE HCL 0.4 MG/ML IJ SOLN
0.4000 mg | Freq: Once | INTRAMUSCULAR | Status: DC
  Filled 2018-02-28: qty 1

## 2018-02-28 MED ORDER — LACTULOSE 10 GM/15ML PO SOLN
40.0000 g | Freq: Three times a day (TID) | ORAL | Status: DC
  Administered 2018-02-28 – 2018-03-02 (×5): 40 g via ORAL
  Filled 2018-02-28 (×6): qty 60

## 2018-02-28 MED ORDER — NALOXONE HCL 0.4 MG/ML IJ SOLN: 0.4000 mg | INTRAMUSCULAR | Status: DC | PRN

## 2018-02-28 MED ORDER — SODIUM CHLORIDE 0.9 % IV SOLN: INTRAVENOUS | Status: DC

## 2018-02-28 MED ORDER — PANTOPRAZOLE SODIUM 40 MG PO TBEC
40.0000 mg | DELAYED_RELEASE_TABLET | Freq: Every day | ORAL | Status: DC
  Administered 2018-03-01 – 2018-03-02 (×2): 40 mg via ORAL
  Filled 2018-02-28 (×2): qty 1

## 2018-02-28 MED ORDER — ONDANSETRON HCL 4 MG/2ML IJ SOLN
4.0000 mg | Freq: Three times a day (TID) | INTRAMUSCULAR | Status: DC | PRN
  Administered 2018-03-01: 4 mg via INTRAVENOUS
  Filled 2018-02-28: qty 2

## 2018-02-28 MED ORDER — NALOXONE HCL 0.4 MG/ML IJ SOLN
0.4000 mg | Freq: Once | INTRAMUSCULAR | Status: AC
  Administered 2018-02-28: 0.4 mg via INTRAVENOUS
  Filled 2018-02-28: qty 1

## 2018-02-28 MED ORDER — METOPROLOL TARTRATE 50 MG PO TABS
50.0000 mg | ORAL_TABLET | Freq: Two times a day (BID) | ORAL | Status: DC
  Administered 2018-02-28 – 2018-03-02 (×4): 50 mg via ORAL
  Filled 2018-02-28 (×4): qty 1

## 2018-02-28 MED ORDER — ACETAMINOPHEN 325 MG PO TABS: 650.0000 mg | ORAL_TABLET | Freq: Four times a day (QID) | ORAL | Status: DC | PRN

## 2018-02-28 MED ORDER — ONDANSETRON 4 MG PO TBDP: 8.0000 mg | ORAL_TABLET | Freq: Three times a day (TID) | ORAL | Status: DC | PRN

## 2018-02-28 MED ORDER — MORPHINE SULFATE (PF) 2 MG/ML IV SOLN: 2.0000 mg | INTRAVENOUS | Status: DC | PRN

## 2018-02-28 MED ORDER — ACETAMINOPHEN 650 MG RE SUPP: 650.0000 mg | Freq: Four times a day (QID) | RECTAL | Status: DC | PRN

## 2018-02-28 NOTE — ED Notes (Signed)
Pt resting quietly with eyes shut.  Family at bedside.

## 2018-02-28 NOTE — ED Notes (Signed)
Asked pt if she was done using the bathroom.  Pt hollered "No"

## 2018-02-28 NOTE — H&P (Addendum)
History and Physical  Marilyn Hanson RUE:[id number] DOB: 1990-10-08 DOA: 02/28/2018  Referring physician: Dr. Clarene [facility], ED physician PCP: System, Pcp Not In  Outpatient Specialists:  Dr Fausto Skillern (Nephrology) Dr Lemont Fillers (ID - [facility])  Patient Coming From: home via dialysis  Chief Complaint: Lethargy, acting aggressive.  HPI: Marilyn Hanson is a 27 y.o. 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  Patient also has chronic pain and is on 2 mg of Dilaudid every 6 hours and OxyContin 10 mg every 12 hours.  Patient has had difficulty with fistula over the past week and has not been to dialysis.  She went to dialysis on Thursday but was unable to get it due to the inability to access her fistula.  She went to the vascular center yesterday, who determined that her fistula was patent.  That evening, the patient became aggressive, argumentative, and disoriented.  She finally settled down and went to sleep.  Patient had to be woken to go to dialysis this morning and initially refused, but her sister and brother convinced her to come.  During dialysis, the patient began to thrash around the bed and began to yell.  She was taken off from dialysis after being on it for 2 hours and her family was instructed to bring her to emergency department for evaluation.  Here, the patient was more lethargic, but became arousable after a dose of Narcan.  She recalls certain events over the past 24 hours but not others.  Her symptoms are currently improved with Narcan.  No other palliating or provoking factors.  She denies fevers, chills, nausea, vomiting, diarrhea.  She does have some loose stools.  She has been taking her pain medicine and Klonopin as prescribed.  Of note, she was recently hospitalized at [facility] in July and was diagnosed with C. difficile,  GBS endocarditis.  She has completed her antibiotics.  Emergency Department Course: Blood work shows a gap metabolic acidosis with a bicarbonate 17 gap of 21.  She is hypochloremic at 95.  BUN 76, creatinine 8.95.  Total bili is 4.3.  Ammonia level is 90.  White count is normal  Review of Systems:   Pt denies any fevers, chills, nausea, vomiting, diarrhea, constipation, abdominal pain, shortness of breath, dyspnea on exertion, orthopnea, cough, wheezing, palpitations, headache, vision changes, lightheadedness, dizziness, melena, rectal bleeding.  Review of systems are otherwise negative  Past Medical History:  Diagnosis Date  . Anemia   . Anxiety   . Ascites   . Bacteremia due to group B Streptococcus 10/2017   [facility] admit  . CHF (congestive heart failure) (HCC)   . Chronic anticoagulation    coumadin  . Chronic edema    RLE>LLE  . Chronic pain syndrome   . Dysphagia   . Endocarditis 10/2017   [facility] admit  . ESRD on hemodialysis (HCC)   . Headache disorder   . Hemodialysis patient (HCC)   . High risk medication use    opiods, benzos  . Hypertension   . Hypertriglyceridemia   . IgA nephropathy   . IgA nephropathy   . IVDU (intravenous drug user) 10/2017   suspected, per [facility] admission notes  . Obesity (BMI 30-39.9)   . On home O2    3L N/C  . Positive hepatitis C antibody test   . Tricuspid regurgitation    Past  Surgical History:  Procedure Laterality Date  . MITRAL VALVE REPLACEMENT     mechanical   Social History:  reports that she has been smoking. She has been smoking about 0.50 packs per day. She has never used smokeless tobacco. She reports that she does not drink alcohol or use drugs. Patient lives at home with brother and sister  Allergies  Allergen Reactions  . Bee Venom Anaphylaxis    Note - tolerates medihoney w/no reaction  . Topiramate Hives, Shortness Of Breath and Swelling  . Azithromycin Itching and Rash  . Gentamicin Rash  . Nitroglycerin Other  (See Comments)    SEVERE ALLERGY Unclear. Cant remember. " vomited black and nearly died/low blood pressure"   . Nsaids Other (See Comments)  . Vancomycin Itching, Rash and Swelling    Per pt report   . Asa [Aspirin] Itching  . Ondansetron Hcl Hives  . Ibuprofen Itching, Swelling and Rash  . Penicillins Itching, Swelling and Rash  . Sulfamethoxazole-Trimethoprim Rash    History reviewed. No pertinent family history.    Prior to Admission medications   Medication Sig Start Date End Date Taking? Authorizing Provider  AURYXIA 1 GM 210 MG(Fe) tablet Take 2 tablets by mouth 3 (three) times daily with meals. 02/18/18  Yes [provider]  clonazePAM (KLONOPIN) 0.5 MG tablet Take 0.5 mg by mouth 2 (two) times daily.    Yes [provider]  diphenhydrAMINE (BENADRYL) 25 MG tablet Take 25-50 mg by mouth every 6 (six) hours as needed for itching.    Yes [provider]  epoetin alfa (EPOGEN,PROCRIT) 2000 UNIT/ML injection Inject 2,000 Units into the skin Every Tuesday,Thursday,and Saturday with dialysis.    Yes [provider]  HYDROmorphone (DILAUDID) 2 MG tablet Take 2 mg by mouth every 6 (six) hours as needed for severe pain.   Yes [provider]  metoprolol tartrate (LOPRESSOR) 50 MG tablet Take 1 tablet (50 mg total) by mouth 2 (two) times daily. 02/13/17  Yes Raeford Razor, MD  naloxone [facility]) 0.4 MG/ML injection Place 0.4 mg into the nose once as needed (for opiod overdose).  08/25/15  Yes [provider]  ondansetron (ZOFRAN-ODT) 8 MG disintegrating tablet Take 8 mg by mouth every 8 (eight) hours as needed for nausea or vomiting.   Yes [provider]  oxyCODONE (OXYCONTIN) 10 mg 12 hr tablet Take 10 mg by mouth every 12 (twelve) hours.   Yes [provider]  OXYGEN Inhale 4 L into the lungs daily.   Yes [provider]  pantoprazole (PROTONIX) 40 MG tablet Take 40 mg by mouth daily.   Yes [provider]  sevelamer (RENAGEL) 800 MG tablet Take 800 mg by mouth 3 (three) times daily with meals.   Yes [provider]  sevelamer carbonate (RENVELA) 0.8 g PACK packet Take 0.8 g by mouth daily. To take with largest meal of the day 02/16/18  Yes [provider]  traZODone (DESYREL) 100 MG tablet Take 50 mg by mouth at bedtime.    Yes [provider]  warfarin (COUMADIN) 4 MG tablet Take 4-5 mg by mouth See admin instructions. Alternates taking 4mg  with 5mg  daily in the evening   Yes [provider]    Physical Exam: BP 128/89   Pulse 73   Temp 97.8 F (36.6 C) (Oral)   Resp 14   Ht 5\' 6"  (1.676 m)   Wt 99.8 kg   SpO2 94%   BMI 35.51 kg/m   .  General: Young Caucasian female who appears older than stated age. Awake and alert and oriented x3. No acute cardiopulmonary distress.  Marland Kitchen HEENT: Normocephalic atraumatic.  Right and left ears normal in appearance.  Pupils equal, round, reactive to light. Extraocular muscles are intact. Sclerae icteric and injected.  Moist mucosal membranes. No mucosal lesions.  . Neck: Neck supple without lymphadenopathy. No carotid bruits. No masses palpated.  . Cardiovascular: Regular rate with normal S1-S2 sounds. No murmurs, rubs, gallops auscultated. No JVD.  Marland Kitchen Respiratory: Good respiratory effort with no wheezes, rales, rhonchi. Lungs clear to auscultation bilaterally.  No accessory muscle use. . Abdomen: Soft, nontender, nondistended. Active bowel sounds. No masses or hepatosplenomegaly  . Skin: Jaundiced.  Multiple healing scabs on forearms bilaterally.  No other rashes, lesions, or ulcerations.  Dry, warm to touch. 2+ dorsalis pedis and radial pulses. . Musculoskeletal: No calf or leg pain. All major joints not erythematous nontender.  No upper or lower joint deformation.  Good ROM.  No contractures  . Psychiatric: Intact judgment and insight. Pleasant and cooperative. . Neurologic: No focal neurological deficits.  Strength is 5/5 and symmetric in upper and lower extremities.  Cranial nerves II through XII are grossly intact.           Labs on Admission: I have personally reviewed following labs and imaging studies  CBC: Recent Labs  Lab 02/28/18 1521  WBC 4.2  NEUTROABS 2.5  HGB 11.4*  HCT 35.8*  MCV 102.9*  PLT 120*   Basic Metabolic Panel: Recent Labs  Lab 02/28/18 1521  NA 133*  K 4.5  CL 95*  CO2 17*  GLUCOSE 99  BUN 76*  CREATININE 8.95*  CALCIUM 8.7*   GFR: Estimated Creatinine Clearance: 11.2 mL/min (A) (by C-G formula based on SCr of 8.95 mg/dL (H)). Liver Function Tests: Recent Labs  Lab 02/28/18 1521  AST 50*  ALT 34  ALKPHOS 318*  BILITOT 4.3*  PROT 8.8*  ALBUMIN 3.7   Recent Labs  Lab 02/28/18 1521  LIPASE 31   Recent Labs  Lab 02/28/18 1750  AMMONIA 90*   Coagulation Profile: Recent Labs  Lab 02/28/18 1521  INR 3.92   Cardiac Enzymes: Recent Labs  Lab 02/28/18 1521  TROPONINI 0.07*   BNP (last 3 results) No results for input(s): PROBNP in the last 8760 hours. HbA1C: No results for input(s): HGBA1C in the last 72 hours. CBG: No results for input(s): GLUCAP in the last 168 hours. Lipid Profile: No results for input(s): CHOL, HDL, LDLCALC, TRIG, CHOLHDL, LDLDIRECT in the last 72 hours. Thyroid Function Tests: No results for input(s): TSH, T4TOTAL, FREET4, T3FREE, THYROIDAB in the last 72 hours. Anemia Panel: No results for input(s): VITAMINB12, FOLATE, FERRITIN, TIBC, IRON, RETICCTPCT in the last 72 hours. Urine analysis: No results found for: COLORURINE, APPEARANCEUR, LABSPEC, PHURINE, GLUCOSEU, HGBUR, BILIRUBINUR, KETONESUR, PROTEINUR, UROBILINOGEN, NITRITE, LEUKOCYTESUR Sepsis Labs: @LABRCNTIP (procalcitonin:4,lacticidven:4) )No results found for this or any previous visit (from the past 240 hour(s)).   Radiological Exams on Admission: Ct Abdomen Pelvis Wo Contrast  Result Date: 02/28/2018 CLINICAL DATA:  Patient with altered  mental status. EXAM: CT ABDOMEN AND PELVIS WITHOUT CONTRAST TECHNIQUE: Multidetector CT imaging of the abdomen and pelvis was performed following the standard protocol without IV contrast. COMPARISON:  None. FINDINGS: Lower chest: Cardiomegaly. Patchy ground-glass and consolidative opacities within the lower lobes bilaterally. Small bilateral pleural effusions. Hepatobiliary: Liver is low in attenuation compatible with steatosis. Gallbladder is unremarkable. No intrahepatic or extrahepatic biliary ductal dilatation. Pancreas: Calcifications  throughout the pancreatic parenchyma. Spleen: Enlarged measuring approximately 15 cm. Adrenals/Urinary Tract: Adrenal glands are unremarkable. Native kidneys are atrophic. Stomach/Bowel: Normal morphology of the stomach. No abnormal bowel wall thickening or evidence for bowel obstruction. No free intraperitoneal air. Vascular/Lymphatic: Normal caliber abdominal aorta. Peripheral calcified atherosclerotic plaque. Bulky retroperitoneal adenopathy. Reference 2.3 cm left periaortic lymph node (image 44; series 2). Reference 1.2 cm aortocaval node (image 43; series 2). Prominent and mildly enlarged lymph nodes extend distally along the aorta and iliac branches. Multiple prominent and enlarged mesenteric lymph nodes are demonstrated including a 1.4 cm node (image 45; series 2) within the small bowel mesentery. Reproductive: Calcified vascularity in the uterus. Other: Moderate volume ascites. Musculoskeletal: Lumbar spine degenerative changes. Soft tissue calcification posterior to the proximal right femur (image 97; series 2). IMPRESSION: 1. Extensive retroperitoneal and small bowel mesenteric adenopathy. Findings are nonspecific and may potentially be reactive in etiology however the possibility of metastatic disease or lymphoma are considerations. 2. Splenomegaly. 3. Moderate volume ascites. 4. Atrophic native kidneys. 5. Hepatic steatosis. Electronically Signed   By: Annia Belt  M.D.   On: 02/28/2018 18:12   Dg Chest 1 View  Result Date: 02/28/2018 CLINICAL DATA:  Altered mental status. EXAM: CHEST  1 VIEW COMPARISON:  None. FINDINGS: Low lung volumes. Probable elevation of the RIGHT hemidiaphragm. Lungs appear grossly clear. No pneumothorax. Suspected cardiomegaly, difficult to characterize due to the low lung volumes and lordotic patient positioning. Median sternotomy wires appear intact and normally aligned. No acute or suspicious osseous finding. IMPRESSION: Low lung volumes. No evidence of pneumonia or pulmonary edema seen. Probable cardiomegaly. Electronically Signed   By: Bary Richard M.D.   On: 02/28/2018 17:37   Ct Head Wo Contrast  Result Date: 02/28/2018 CLINICAL DATA:  Fatigue and malaise EXAM: CT HEAD WITHOUT CONTRAST TECHNIQUE: Contiguous axial images were obtained from the base of the skull through the vertex without intravenous contrast. COMPARISON:  None. FINDINGS: Brain: No evidence of acute infarction, hemorrhage, hydrocephalus, extra-axial collection or mass lesion/mass effect. Chronic right occipital lobe infarct is identified with laminar calcifications. There is mild diffuse low-attenuation within the subcortical and periventricular white matter compatible with chronic microvascular disease. Vascular: No hyperdense vessel or unexpected calcification. Skull: Normal. Negative for fracture or focal lesion. Sinuses/Orbits: No acute finding. Other: None. IMPRESSION: 1. No acute intracranial abnormalities. 2. Chronic small vessel ischemic change. 3. Chronic right occipital lobe infarct. Electronically Signed   By: Signa Kell M.D.   On: 02/28/2018 17:36    EKG: Independently reviewed.  Sinus rhythm with intraventricular conduction delay.  No acute ST changes  Assessment/Plan: Principal Problem:   Acute encephalopathy Active Problems:   Hypertension   IgA nephropathy   ESRD on hemodialysis (HCC)   Positive hepatitis C antibody test   Chronic  anticoagulation   Mechanical heart valve present   Metabolic acidosis    This patient was discussed with the ED physician, including pertinent vitals, physical exam findings, labs, and imaging.  We also discussed care given by the ED provider.  1. Acute encephalopathy a. Question hepatic encephalopathy versus buildup of metabolites from narcotics and benzodiazepines b. Patient currently appears to be alert and oriented x3. c. No evidence of infection d. Continue Narcan reversal e. We will give lactulose for hepatic encephalopathy f. Hold Narcotics and benzos g. Repeat CMP and ammonia level tomorrow 2. Gap metabolic acidosis a. Likely secondary to uremia b. I did discuss patient with Dr. Arlean Hopping with nephrology.  No indication for urgent  dialysis tonight, given that the patient is currently compensated from an encephalopathy perspective and no active seizures or any complications 3. End-stage renal disease on hemodialysis a. Dialysis tomorrow 4. Hypertension a. Continue antihypertensives 5. IgA nephropathy 6. Hepatitis C 7. Chronic anticoagulation a. Consult pharmacy for Coumadin management 8. Mechanical heart valve present a. On Coumadin  DVT prophylaxis: Supratherapeutic on Coumadin Consultants: Pharmacy, nephrology Code Status: Full code Family Communication: Brother and sister present during interview and exam Disposition Plan: Patient should be able to return home following admission   Noralee Chars [facility] Pager [phone]  If 7PM-7AM, please contact night-coverage [email or web address] Password [email or web address]

## 2018-02-28 NOTE — ED Notes (Signed)
Family called out.  Pt climbing out of bed.  Pt hollering and climbing over rail.  Pt states she has to go to the bathroom.  Pt placed on BSC.

## 2018-02-28 NOTE — ED Notes (Signed)
Pt back from CT of abdomen and pelvis.  Pt resting quietly at this time with eyes shut.  Arouses when moved.   No distress.

## 2018-02-28 NOTE — ED Notes (Signed)
CRITICAL VALUE ALERT  Critical Value:  troponin0.07  Date & Time Notied:  02/28/2018 at 1608  Provider Notified: Dr.  Clarene [facility]   Orders Received/Actions taken: none

## 2018-02-28 NOTE — Progress Notes (Signed)
ANTICOAGULATION CONSULT NOTE - Initial Consult  Pharmacy Consult for: warfarin dosing Indication: mechanical heart valve  Allergies  Allergen Reactions  . Bee Venom Anaphylaxis    Note - tolerates medihoney w/no reaction  . Topiramate Hives, Shortness Of Breath and Swelling  . Azithromycin Itching and Rash  . Gentamicin Rash  . Nitroglycerin Other (See Comments)    SEVERE ALLERGY Unclear. Cant remember. " vomited black and nearly died/low blood pressure"   . Nsaids Other (See Comments)  . Vancomycin Itching, Rash and Swelling    Per pt report   . Asa [Aspirin] Itching  . Ondansetron Hcl Hives  . Ibuprofen Itching, Swelling and Rash  . Penicillins Itching, Swelling and Rash  . Sulfamethoxazole-Trimethoprim Rash    Patient Measurements: Height: 5\' 6"  (167.6 cm) Weight: 220 lb (99.8 kg) IBW/kg (Calculated) : 59.3    Vital Signs: Temp: 97.8 F (36.6 C) (10/12 1445) Temp Source: Oral (10/12 1445) BP: 128/89 (10/12 1930) Pulse Rate: 73 (10/12 1830)  Labs: Recent Labs    02/28/18 1521  HGB 11.4*  HCT 35.8*  PLT 120*  LABPROT 38.1*  INR 3.92  CREATININE 8.95*  TROPONINI 0.07*    Estimated Creatinine Clearance: 11.2 mL/min (A) (by C-G formula based on SCr of 8.95 mg/dL (H)).   Medical History: Past Medical History:  Diagnosis Date  . Anemia   . Anxiety   . Ascites   . Bacteremia due to group B Streptococcus 10/2017   [facility] admit  . CHF (congestive heart failure) (HCC)   . Chronic anticoagulation    coumadin  . Chronic edema    RLE>LLE  . Chronic pain syndrome   . Dysphagia   . Endocarditis 10/2017   [facility] admit  . ESRD on hemodialysis (HCC)   . Headache disorder   . Hemodialysis patient (HCC)   . High risk medication use    opiods, benzos  . Hypertension   . Hypertriglyceridemia   . IgA nephropathy   . IgA nephropathy   . IVDU (intravenous drug user) 10/2017   suspected, per [facility] admission notes  . Obesity (BMI 30-39.9)   . On home O2    3L  N/C  . Positive hepatitis C antibody test   . Tricuspid regurgitation       Assessment: Pharmacy consulted to dose warfarin for this 27 yof on chronic anticoagulation with warfarin. Current INR of 3.92 is supra-therapeutic.  Goal of Therapy:  INR 2-3 Monitor platelets by anticoagulation protocol: Yes   Plan:  Hold warfarin tonight, as pt has likely had dose already today.     Tama High 02/28/2018,7:45 PM

## 2018-02-28 NOTE — ED Provider Notes (Signed)
[facility] EMERGENCY DEPARTMENT Provider Note   CSN: [id number] Arrival date & time: 02/28/18  1432     History   Chief Complaint Chief Complaint  Patient presents with  . Altered Mental Status    HPI Marilyn Hanson is a 27 y.o. female.  The history is provided by the patient and the EMS personnel. The history is limited by the condition of the patient (Lethargy).  Altered Mental Status      Pt was seen at 1450. Per EMS, HD staff report, pt's family and pt: Pt was 2 hours into her HD session when she became "lethargic." Pt will awaken to her name on arrival to ED, states during HD she felt "weak and lightheaded and scared."  Pt apparently has not had HD since last Saturday due to malfunctioning fistula. Pt was evaluated by MD yesterday and she was "given sedating medications when they fixed her fistula." Pt's family states pt was "upset" when they brought her to HD this morning. Pt herself states she took her usual meds today, denies taking extra meds or illicits. Pt denies any specific symptoms. Denies CP/SOB, no cough, no abd pain, no N/V/D, no fevers, no rash, no back pain, no focal motor weakness, no tingling/numbness in extremities.    Past Medical History:  Diagnosis Date  . Anemia   . Anxiety   . Ascites   . Bacteremia due to group B Streptococcus 10/2017   [facility] admit  . CHF (congestive heart failure) (HCC)   . Chronic anticoagulation    coumadin  . Chronic edema    RLE>LLE  . Chronic pain syndrome   . Dysphagia   . Endocarditis 10/2017   [facility] admit  . ESRD on hemodialysis (HCC)   . Headache disorder   . Hemodialysis patient (HCC)   . High risk medication use    opiods, benzos  . Hypertension   . Hypertriglyceridemia   . IgA nephropathy   . IgA nephropathy   . IVDU (intravenous drug user) 10/2017   suspected, per [facility] admission notes  . Obesity (BMI 30-39.9)   . On home O2    3L N/C  . Positive hepatitis C antibody test   . Tricuspid regurgitation      There are no active problems to display for this patient.   Past Surgical History:  Procedure Laterality Date  . MITRAL VALVE REPLACEMENT     mechanical     OB History   None      Home Medications    Prior to Admission medications   Medication Sig Start Date End Date Taking? Authorizing Provider  clonazePAM (KLONOPIN) 1 MG tablet Take 1 mg by mouth 4 (four) times daily as needed.     [provider]  diphenhydrAMINE (BENADRYL) 25 MG tablet Take 25-50 mg by mouth every 6 (six) hours as needed for itching.     [provider]  epoetin alfa (EPOGEN,PROCRIT) 2000 UNIT/ML injection Inject 2,000 Units into the skin Every Tuesday,Thursday,and Saturday with dialysis.     [provider]  metoprolol tartrate (LOPRESSOR) 50 MG tablet Take 1 tablet (50 mg total) by mouth 2 (two) times daily. 02/13/17   Raeford Razor, MD  oxyCODONE (OXYCONTIN) 60 MG 12 hr tablet Take 60 mg by mouth 2 times daily at 12 noon and 4 pm.    [provider]  Oxycodone HCl 10 MG TABS Take 10 mg by mouth daily.    [provider]  OXYGEN Inhale 4 L  into the lungs daily.    [provider]  pantoprazole (PROTONIX) 40 MG tablet Take 40 mg by mouth daily.    [provider]  sevelamer (RENAGEL) 800 MG tablet Take 800 mg by mouth 3 (three) times daily with meals.    [provider]  traZODone (DESYREL) 100 MG tablet Take 100 mg by mouth at bedtime.    [provider]  warfarin (COUMADIN) 4 MG tablet Take 3-4 mg by mouth as directed. 3mg  on M-W-F/ 4mg  on T-TH-SS    [provider]    Family History History reviewed. No pertinent family history.  Social History Social History   Tobacco Use  . Smoking status: Current Every Day Smoker    Packs/day: 0.50  . Smokeless tobacco: Never Used  Substance Use Topics  . Alcohol use: No  . Drug use: No     Allergies   Asa [aspirin]; Ibuprofen; and Penicillins   Review of  Systems Review of Systems  Unable to perform ROS: Mental status change     Physical Exam Updated Vital Signs BP 114/80 (BP Location: Right Arm)   Pulse 77   Temp 97.8 F (36.6 C) (Oral)   Resp 10   Ht 5\' 6"  (1.676 m)   Wt 99.8 kg   SpO2 94%   BMI 35.51 kg/m    Patient Vitals for the past 24 hrs:  BP Temp Temp src Pulse Resp SpO2 Height Weight  02/28/18 1815 - - - 73 16 97 % - -  02/28/18 1800 99/69 - - - 15 - - -  02/28/18 1709 100/73 - - - 16 - - -  02/28/18 1703 - - - 71 20 100 % - -  02/28/18 1500 110/83 - - 76 15 95 % - -  02/28/18 1445 114/80 97.8 F (36.6 C) Oral 77 10 94 % - -  02/28/18 1436 - - - - - - 5\' 6"  (1.676 m) 99.8 kg     Physical Exam 1455: Physical examination:  Nursing notes reviewed; Vital signs and O2 SAT reviewed;  Constitutional: Well developed, Well nourished, Well hydrated, In no acute distress; Head:  Normocephalic, atraumatic; Eyes: EOMI, PERRL, No scleral icterus. Conjunctiva injected bilat.; ENMT: Mouth and pharynx normal, Mucous membranes moist; Neck: Supple, Full range of motion, No lymphadenopathy; Cardiovascular: Regular rate and rhythm, No gallop; Respiratory: Breath sounds clear & equal bilaterally, No wheezes.  Speaking full sentences with ease, Normal respiratory effort/excursion; Chest: Nontender, Movement normal; Abdomen: Soft, Nontender, Nondistended, Normal bowel sounds; Genitourinary: No CVA tenderness; Extremities: Peripheral pulses normal, No deformity. No tenderness, RLE>LLE edema per hx. No calf tenderness. Palp pedal pulses..; Neuro: Lethargic, awakens to name then falls back asleep. No facial droop.  Speech clear. Moves all extremities spontaneously and to command without apparent gross focal motor deficits.; Skin: Color normal, Warm, Dry.    ED Treatments / Results  Labs (all labs ordered are listed, but only abnormal results are displayed)   EKG EKG Interpretation  Date/Time:  Saturday February 28 2018 14:46:10  EDT Ventricular Rate:  77 PR Interval:    QRS Duration: 134 QT Interval:  445 QTC Calculation: 504 R Axis:   151 Text Interpretation:  Sinus rhythm Short PR interval Nonspecific intraventricular conduction delay Baseline wander When compared with ECG of 02/13/2017 No significant change was found Confirmed by Samuel Jester [phone]) on 02/28/2018 3:18:53 PM   Radiology   Procedures Procedures (including critical care time)  Medications Ordered in ED Medications  naloxone (  NARCAN) injection 0.4 mg (0.4 mg Intravenous Given 02/28/18 1523)     Initial Impression / Assessment and Plan / ED Course  I have reviewed the triage vital signs and the nursing notes.  Pertinent labs & imaging results that were available during my care of the patient were reviewed by me and considered in my medical decision making (see chart for details).  MDM Reviewed: previous chart, nursing note and vitals Reviewed previous: labs and ECG Interpretation: labs, ECG, x-ray and CT scan    Results for orders placed or performed during the hospital encounter of 02/28/18  Acetaminophen level  Result Value Ref Range   Acetaminophen (Tylenol), Serum <10 (L) 10 - 30 ug/mL  Comprehensive metabolic panel  Result Value Ref Range   Sodium 133 (L) 135 - 145 mmol/L   Potassium 4.5 3.5 - 5.1 mmol/L   Chloride 95 (L) 98 - 111 mmol/L   CO2 17 (L) 22 - 32 mmol/L   Glucose, Bld 99 70 - 99 mg/dL   BUN 76 (H) 6 - 20 mg/dL   Creatinine, Ser 1.61 (H) 0.44 - 1.00 mg/dL   Calcium 8.7 (L) 8.9 - 10.3 mg/dL   Total Protein 8.8 (H) 6.5 - 8.1 g/dL   Albumin 3.7 3.5 - 5.0 g/dL   AST 50 (H) 15 - 41 U/L   ALT 34 0 - 44 U/L   Alkaline Phosphatase 318 (H) 38 - 126 U/L   Total Bilirubin 4.3 (H) 0.3 - 1.2 mg/dL   GFR calc non Af Amer 5 (L) >60 mL/min   GFR calc Af Amer 6 (L) >60 mL/min   Anion gap 21 (H) 5 - 15  Ethanol  Result Value Ref Range   Alcohol, Ethyl (B) <10 <10 mg/dL  Salicylate level  Result Value Ref Range    Salicylate Lvl <7.0 2.8 - 30.0 mg/dL  Troponin I  Result Value Ref Range   Troponin I 0.07 (HH) <0.03 ng/mL  Lactic acid, plasma  Result Value Ref Range   Lactic Acid, Venous 1.6 0.5 - 1.9 mmol/L  Lactic acid, plasma  Result Value Ref Range   Lactic Acid, Venous 1.6 0.5 - 1.9 mmol/L  CBC with Differential  Result Value Ref Range   WBC 4.2 4.0 - 10.5 K/uL   RBC 3.48 (L) 3.87 - 5.11 MIL/uL   Hemoglobin 11.4 (L) 12.0 - 15.0 g/dL   HCT 09.6 (L) 04.5 - 40.9 %   MCV 102.9 (H) 80.0 - 100.0 fL   MCH 32.8 26.0 - 34.0 pg   MCHC 31.8 30.0 - 36.0 g/dL   RDW 81.1 (H) 91.4 - 78.2 %   Platelets 120 (L) 150 - 400 K/uL   nRBC 0.0 0.0 - 0.2 %   Neutrophils Relative % 60 %   Neutro Abs 2.5 1.7 - 7.7 K/uL   Lymphocytes Relative 23 %   Lymphs Abs 1.0 0.7 - 4.0 K/uL   Monocytes Relative 10 %   Monocytes Absolute 0.4 0.1 - 1.0 K/uL   Eosinophils Relative 5 %   Eosinophils Absolute 0.2 0.0 - 0.5 K/uL   Basophils Relative 1 %   Basophils Absolute 0.1 0.0 - 0.1 K/uL   Immature Granulocytes 1 %   Abs Immature Granulocytes 0.03 0.00 - 0.07 K/uL  Protime-INR  Result Value Ref Range   Prothrombin Time 38.1 (H) 11.4 - 15.2 seconds   INR 3.92   Lipase, blood  Result Value Ref Range   Lipase 31 11 - 51 U/L  Ammonia  Result Value Ref Range   Ammonia 90 (H) 9 - 35 umol/L  I-Stat beta hCG blood, ED  Result Value Ref Range   I-stat hCG, quantitative <5.0 <5 mIU/mL   Comment 3           Ct Abdomen Pelvis Wo Contrast Result Date: 02/28/2018 CLINICAL DATA:  Patient with altered mental status. EXAM: CT ABDOMEN AND PELVIS WITHOUT CONTRAST TECHNIQUE: Multidetector CT imaging of the abdomen and pelvis was performed following the standard protocol without IV contrast. COMPARISON:  None. FINDINGS: Lower chest: Cardiomegaly. Patchy ground-glass and consolidative opacities within the lower lobes bilaterally. Small bilateral pleural effusions. Hepatobiliary: Liver is low in attenuation compatible with steatosis.  Gallbladder is unremarkable. No intrahepatic or extrahepatic biliary ductal dilatation. Pancreas: Calcifications throughout the pancreatic parenchyma. Spleen: Enlarged measuring approximately 15 cm. Adrenals/Urinary Tract: Adrenal glands are unremarkable. Native kidneys are atrophic. Stomach/Bowel: Normal morphology of the stomach. No abnormal bowel wall thickening or evidence for bowel obstruction. No free intraperitoneal air. Vascular/Lymphatic: Normal caliber abdominal aorta. Peripheral calcified atherosclerotic plaque. Bulky retroperitoneal adenopathy. Reference 2.3 cm left periaortic lymph node (image 44; series 2). Reference 1.2 cm aortocaval node (image 43; series 2). Prominent and mildly enlarged lymph nodes extend distally along the aorta and iliac branches. Multiple prominent and enlarged mesenteric lymph nodes are demonstrated including a 1.4 cm node (image 45; series 2) within the small bowel mesentery. Reproductive: Calcified vascularity in the uterus. Other: Moderate volume ascites. Musculoskeletal: Lumbar spine degenerative changes. Soft tissue calcification posterior to the proximal right femur (image 97; series 2). IMPRESSION: 1. Extensive retroperitoneal and small bowel mesenteric adenopathy. Findings are nonspecific and may potentially be reactive in etiology however the possibility of metastatic disease or lymphoma are considerations. 2. Splenomegaly. 3. Moderate volume ascites. 4. Atrophic native kidneys. 5. Hepatic steatosis. Electronically Signed   By: Annia Belt M.D.   On: 02/28/2018 18:12   Dg Chest 1 View Result Date: 02/28/2018 CLINICAL DATA:  Altered mental status. EXAM: CHEST  1 VIEW COMPARISON:  None. FINDINGS: Low lung volumes. Probable elevation of the RIGHT hemidiaphragm. Lungs appear grossly clear. No pneumothorax. Suspected cardiomegaly, difficult to characterize due to the low lung volumes and lordotic patient positioning. Median sternotomy wires appear intact and normally  aligned. No acute or suspicious osseous finding. IMPRESSION: Low lung volumes. No evidence of pneumonia or pulmonary edema seen. Probable cardiomegaly. Electronically Signed   By: Bary Richard M.D.   On: 02/28/2018 17:37   Ct Head Wo Contrast Result Date: 02/28/2018 CLINICAL DATA:  Fatigue and malaise EXAM: CT HEAD WITHOUT CONTRAST TECHNIQUE: Contiguous axial images were obtained from the base of the skull through the vertex without intravenous contrast. COMPARISON:  None. FINDINGS: Brain: No evidence of acute infarction, hemorrhage, hydrocephalus, extra-axial collection or mass lesion/mass effect. Chronic right occipital lobe infarct is identified with laminar calcifications. There is mild diffuse low-attenuation within the subcortical and periventricular white matter compatible with chronic microvascular disease. Vascular: No hyperdense vessel or unexpected calcification. Skull: Normal. Negative for fracture or focal lesion. Sinuses/Orbits: No acute finding. Other: None. IMPRESSION: 1. No acute intracranial abnormalities. 2. Chronic small vessel ischemic change. 3. Chronic right occipital lobe infarct. Electronically Signed   By: Signa Kell M.D.   On: 02/28/2018 17:36    1850:  IV narcan given shortly after arrival to the ED with good effect. Pt became awake/alert, easily agitated. Pt falling asleep again while sitting on BSC while I spoke with her and family regarding dx testing and tx plan. Family  continued to question why pt "was so tired." I explained that pt may not be quickly metabolizing her opiates and benzos, and that I will give her another dose of IV narcan to have her fully awaken again. Pt immediately lifted her head up and screamed "NO! Don't you give me any more narcan!" I asked pt to then please stay awake for our Hospitalist's upcoming evaluation for admission. Pt verb understanding and said "ok." No clear need for emergent HD at this time. New Tbili elevation; but CT A/P unrevealing.  No fever while in the ED, WBC count normal; no indication for [facility] at this time. Pt does not make urine. H/H and platelets per baseline.  T/C returned from [facility]. Adrian Blackwater, case discussed, including:  HPI, pertinent PM/SHx, VS/PE, dx testing, ED course and treatment:  Agreeable to admit.       Final Clinical Impressions(s) / ED Diagnoses   Final diagnoses:  None    ED Discharge Orders    None       Samuel Jester, DO 03/04/18 1610

## 2018-02-28 NOTE — ED Triage Notes (Signed)
Pt brought in by ems for altered mental status occurring after about 2 hours of dialysis.  Pt lethargic on arrival to ED and cannot answer questions appropriately.

## 2018-02-28 NOTE — ED Notes (Signed)
Pt finished with BSC.  Assisted back to bed.   Preparing to go for CT.

## 2018-02-28 NOTE — ED Notes (Signed)
Narcan given,  Pt more responsive after.  Sister and brother at bedside.er sister pt has not had a full dialysis treatment since last Saturday.  States she had 2 hours today and was removed .  Pt hollars out randomly.   States she is scared.

## 2018-03-01 ENCOUNTER — Other Ambulatory Visit: Payer: Self-pay

## 2018-03-01 DIAGNOSIS — G92 Toxic encephalopathy: Secondary | ICD-10-CM | POA: Diagnosis not present

## 2018-03-01 DIAGNOSIS — G9340 Encephalopathy, unspecified: Secondary | ICD-10-CM | POA: Diagnosis not present

## 2018-03-01 LAB — COMPREHENSIVE METABOLIC PANEL
ALBUMIN: 3.3 g/dL — AB (ref 3.5–5.0)
ALT: 31 U/L (ref 0–44)
ANION GAP: 20 — AB (ref 5–15)
AST: 46 U/L — ABNORMAL HIGH (ref 15–41)
Alkaline Phosphatase: 290 U/L — ABNORMAL HIGH (ref 38–126)
BUN: 84 mg/dL — ABNORMAL HIGH (ref 6–20)
CHLORIDE: 98 mmol/L (ref 98–111)
CO2: 21 mmol/L — AB (ref 22–32)
Calcium: 8.9 mg/dL (ref 8.9–10.3)
Creatinine, Ser: 10 mg/dL — ABNORMAL HIGH (ref 0.44–1.00)
GFR calc Af Amer: 5 mL/min — ABNORMAL LOW (ref >60)
GFR calc non Af Amer: 5 mL/min — ABNORMAL LOW (ref >60)
GLUCOSE: 99 mg/dL (ref 70–99)
POTASSIUM: 4.6 mmol/L (ref 3.5–5.1)
SODIUM: 139 mmol/L (ref 135–145)
Total Bilirubin: 3.7 mg/dL — ABNORMAL HIGH (ref 0.3–1.2)
Total Protein: 8.3 g/dL — ABNORMAL HIGH (ref 6.5–8.1)

## 2018-03-01 LAB — CBC
HCT: 35.2 % — ABNORMAL LOW (ref 36.0–46.0)
HEMOGLOBIN: 11.3 g/dL — AB (ref 12.0–15.0)
MCH: 33.7 pg (ref 26.0–34.0)
MCHC: 32.1 g/dL (ref 30.0–36.0)
MCV: 105.1 fL — ABNORMAL HIGH (ref 80.0–100.0)
PLATELETS: 129 10*3/uL — AB (ref 150–400)
RBC: 3.35 MIL/uL — ABNORMAL LOW (ref 3.87–5.11)
RDW: 16.2 % — AB (ref 11.5–15.5)
WBC: 4.7 10*3/uL (ref 4.0–10.5)
nRBC: 0 % (ref 0.0–0.2)

## 2018-03-01 LAB — AMMONIA: Ammonia: 127 umol/L — ABNORMAL HIGH (ref 9–35)

## 2018-03-01 LAB — MRSA PCR SCREENING: MRSA by PCR: NEGATIVE

## 2018-03-01 LAB — PROTIME-INR
INR: 2.67
Prothrombin Time: 28.2 seconds — ABNORMAL HIGH (ref 11.4–15.2)

## 2018-03-01 MED ORDER — SODIUM CHLORIDE 0.9 % IV SOLN: 100.0000 mL | INTRAVENOUS | Status: DC | PRN

## 2018-03-01 MED ORDER — LIDOCAINE-PRILOCAINE 2.5-2.5 % EX CREA: 1.0000 "application " | TOPICAL_CREAM | CUTANEOUS | Status: DC | PRN

## 2018-03-01 MED ORDER — CHLORHEXIDINE GLUCONATE CLOTH 2 % EX PADS
6.0000 | MEDICATED_PAD | Freq: Every day | CUTANEOUS | Status: DC
  Administered 2018-03-01: 6 via TOPICAL

## 2018-03-01 MED ORDER — LIDOCAINE HCL (PF) 1 % IJ SOLN: 5.0000 mL | INTRAMUSCULAR | Status: DC | PRN

## 2018-03-01 MED ORDER — WARFARIN SODIUM 2 MG PO TABS
4.0000 mg | ORAL_TABLET | Freq: Once | ORAL | Status: AC
  Administered 2018-03-01: 4 mg via ORAL
  Filled 2018-03-01: qty 2

## 2018-03-01 MED ORDER — OXYCODONE HCL 5 MG PO TABS
5.0000 mg | ORAL_TABLET | Freq: Four times a day (QID) | ORAL | Status: DC | PRN
  Administered 2018-03-01 (×2): 5 mg via ORAL
  Filled 2018-03-01 (×2): qty 1

## 2018-03-01 MED ORDER — PENTAFLUOROPROP-TETRAFLUOROETH EX AERO: 1.0000 "application " | INHALATION_SPRAY | CUTANEOUS | Status: DC | PRN

## 2018-03-01 NOTE — Consult Note (Signed)
Reason for Consult: End-stage renal disease Referring Physician: Dr. Buel Ream is an 27 y.o. female.  HPI: She is a patient who has history of hypertension, IgA nephropathy, hepatitis C infection, status post mechanical valve placement and end-stage renal disease presently was brought from dialysis unit because of increased confusion, disorientation.  Patient at this moment does not remember but she was told that she was yelling and was restless on hemodialysis.  After she came to the emergency room she was given some Narcan with some improvement.  Presently patient is alert and oriented.  She denies any nausea or vomiting.  Overall at this moment she is feeling much better.  She had dialysis for 2 hours yesterday.  Past Medical History:  Diagnosis Date  . Anemia   . Anxiety   . Ascites   . Bacteremia due to group B Streptococcus 10/2017   [facility] admit  . CHF (congestive heart failure) ([place])   . Chronic anticoagulation    coumadin  . Chronic edema    RLE>LLE  . Chronic pain syndrome   . Dysphagia   . Endocarditis 10/2017   [facility] admit  . ESRD on hemodialysis ([place])   . Headache disorder   . Hemodialysis patient ([facility])   . High risk medication use    opiods, benzos  . Hypertension   . Hypertriglyceridemia   . IgA nephropathy   . IgA nephropathy   . IVDU (intravenous drug user) 10/2017   suspected, per [facility] admission notes  . Obesity (BMI 30-39.9)   . On home O2    3L N/C  . Positive hepatitis C antibody test   . Tricuspid regurgitation     Past Surgical History:  Procedure Laterality Date  . MITRAL VALVE REPLACEMENT     mechanical    History reviewed. No pertinent family history.  Social History:  reports that she has been smoking. She has been smoking about 0.50 packs per day. She has never used smokeless tobacco. She reports that she does not drink alcohol or use drugs.  Allergies:  Allergies  Allergen Reactions  . Bee Venom Anaphylaxis    Note - tolerates  medihoney w/no reaction  . Topiramate Hives, Shortness Of Breath and Swelling  . Azithromycin Itching and Rash  . Gentamicin Rash  . Nitroglycerin Other (See Comments)    SEVERE ALLERGY Unclear. Cant remember. " vomited black and nearly died/low blood pressure"   . Nsaids Other (See Comments)  . Vancomycin Itching, Rash and Swelling    Per pt report   . Asa [Aspirin] Itching  . Ondansetron Hcl Hives  . Ibuprofen Itching, Swelling and Rash  . Penicillins Itching, Swelling and Rash  . Sulfamethoxazole-Trimethoprim Rash    Medications: I have reviewed the patient's current medications.  Results for orders placed or performed during the hospital encounter of 02/28/18 (from the past 48 hour(s))  Acetaminophen level     Status: Abnormal   Collection Time: 02/28/18  3:21 PM  Result Value Ref Range   Acetaminophen (Tylenol), Serum <10 (L) 10 - 30 ug/mL    Comment: (NOTE) Therapeutic concentrations vary significantly. A range of 10-30 ug/mL  may be an effective concentration for many patients. However, some  are best treated at concentrations outside of this range. Acetaminophen concentrations >150 ug/mL at 4 hours after ingestion  and >50 ug/mL at 12 hours after ingestion are often associated with  toxic reactions. Performed at [facility], [address], [place], [place] [address]  Comprehensive metabolic panel     Status: Abnormal   Collection Time: 02/28/18  3:21 PM  Result Value Ref Range   Sodium 133 (L) 135 - 145 mmol/L   Potassium 4.5 3.5 - 5.1 mmol/L   Chloride 95 (L) 98 - 111 mmol/L   CO2 17 (L) 22 - 32 mmol/L   Glucose, Bld 99 70 - 99 mg/dL   BUN 76 (H) 6 - 20 mg/dL   Creatinine, Ser 8.95 (H) 0.44 - 1.00 mg/dL   Calcium 8.7 (L) 8.9 - 10.3 mg/dL   Total Protein 8.8 (H) 6.5 - 8.1 g/dL   Albumin 3.7 3.5 - 5.0 g/dL   AST 50 (H) 15 - 41 U/L   ALT 34 0 - 44 U/L   Alkaline Phosphatase 318 (H) 38 - 126 U/L   Total Bilirubin 4.3 (H) 0.3 - 1.2 mg/dL   GFR calc non Af  Amer 5 (L) >60 mL/min   GFR calc Af Amer 6 (L) >60 mL/min    Comment: (NOTE) The eGFR has been calculated using the CKD EPI equation. This calculation has not been validated in all clinical situations. eGFR's persistently <60 mL/min signify possible Chronic Kidney Disease.    Anion gap 21 (H) 5 - 15    Comment: Performed at [facility], [address], [place], [place] [address]  Ethanol     Status: None   Collection Time: 02/28/18  3:21 PM  Result Value Ref Range   Alcohol, Ethyl (B) <10 <10 mg/dL    Comment: (NOTE) Lowest detectable limit for serum alcohol is 10 mg/dL. For medical purposes only. Performed at [facility], [address] [place], [place] [address]   Salicylate level     Status: None   Collection Time: 02/28/18  3:21 PM  Result Value Ref Range   Salicylate Lvl <1.5 2.8 - 30.0 mg/dL    Comment: Performed at [facility], [address], [place], [place] [address]  Troponin I     Status: Abnormal   Collection Time: 02/28/18  3:21 PM  Result Value Ref Range   Troponin I 0.07 (HH) <0.03 ng/mL    Comment: CRITICAL RESULT CALLED TO, READ BACK BY AND VERIFIED WITH: CRUISE,J AT 7619 ON 02/28/2018 BY MOSLEY,J Performed at [facility], [address] [place], [place] [address]   Lactic acid, plasma     Status: None   Collection Time: 02/28/18  3:21 PM  Result Value Ref Range   Lactic Acid, Venous 1.6 0.5 - 1.9 mmol/L    Comment: Performed at [facility], [address], [place], [place] [address]  CBC with Differential     Status: Abnormal   Collection Time: 02/28/18  3:21 PM  Result Value Ref Range   WBC 4.2 4.0 - 10.5 K/uL   RBC 3.48 (L) 3.87 - 5.11 MIL/uL   Hemoglobin 11.4 (L) 12.0 - 15.0 g/dL   HCT 35.8 (L) 36.0 - 46.0 %   MCV 102.9 (H) 80.0 - 100.0 fL   MCH 32.8 26.0 - 34.0 pg   MCHC 31.8 30.0 - 36.0 g/dL   RDW 16.2 (H) 11.5 - 15.5 %   Platelets 120 (L) 150 - 400 K/uL   nRBC 0.0 0.0 - 0.2 %   Neutrophils Relative % 60 %   Neutro Abs 2.5 1.7 - 7.7 K/uL    Lymphocytes Relative 23 %   Lymphs Abs 1.0 0.7 - 4.0 K/uL   Monocytes Relative 10 %   Monocytes Absolute 0.4 0.1 -  1.0 K/uL   Eosinophils Relative 5 %   Eosinophils Absolute 0.2 0.0 - 0.5 K/uL   Basophils Relative 1 %   Basophils Absolute 0.1 0.0 - 0.1 K/uL   Immature Granulocytes 1 %   Abs Immature Granulocytes 0.03 0.00 - 0.07 K/uL    Comment: Performed at [facility], [address], [place], [place] [address]  Protime-INR     Status: Abnormal   Collection Time: 02/28/18  3:21 PM  Result Value Ref Range   Prothrombin Time 38.1 (H) 11.4 - 15.2 seconds   INR 3.92     Comment: Performed at [facility], [address], [place], [place] [address]  Lipase, blood     Status: None   Collection Time: 02/28/18  3:21 PM  Result Value Ref Range   Lipase 31 11 - 51 U/L    Comment: Performed at [facility], [address] [place], [place] [address]  I-Stat beta hCG blood, ED     Status: None   Collection Time: 02/28/18  5:19 PM  Result Value Ref Range   I-stat hCG, quantitative <5.0 <5 mIU/mL   Comment 3            Comment:   GEST. AGE      CONC.  (mIU/mL)   <=1 WEEK        5 - 50     2 WEEKS       50 - 500     3 WEEKS       100 - 10,000     4 WEEKS     1,000 - 30,000        FEMALE AND NON-PREGNANT FEMALE:     LESS THAN 5 mIU/mL   Lactic acid, plasma     Status: None   Collection Time: 02/28/18  5:50 PM  Result Value Ref Range   Lactic Acid, Venous 1.6 0.5 - 1.9 mmol/L    Comment: Performed at [facility], [address], [place], [place] [address]  Ammonia     Status: Abnormal   Collection Time: 02/28/18  5:50 PM  Result Value Ref Range   Ammonia 90 (H) 9 - 35 umol/L    Comment: Performed at [facility], [address] [place], [place] [address]  MRSA PCR Screening     Status: None   Collection Time: 02/28/18  9:36 PM  Result Value Ref Range   MRSA by PCR NEGATIVE NEGATIVE    Comment:        The GeneXpert MRSA Assay (FDA approved for NASAL specimens only), is one component  of a comprehensive MRSA colonization surveillance program. It is not intended to diagnose MRSA infection nor to guide or monitor treatment for MRSA infections. Performed at [facility], [address] [place], [place] [address]   CBC     Status: Abnormal   Collection Time: 03/01/18  4:46 AM  Result Value Ref Range   WBC 4.7 4.0 - 10.5 K/uL   RBC 3.35 (L) 3.87 - 5.11 MIL/uL   Hemoglobin 11.3 (L) 12.0 - 15.0 g/dL   HCT 35.2 (L) 36.0 - 46.0 %   MCV 105.1 (H) 80.0 - 100.0 fL   MCH 33.7 26.0 - 34.0 pg   MCHC 32.1 30.0 - 36.0 g/dL   RDW 16.2 (H) 11.5 - 15.5 %   Platelets 129 (L) 150 - 400 K/uL   nRBC 0.0 0.0 - 0.2 %    Comment: Performed at [facility], [address],  [place], [place] [address]  Comprehensive metabolic panel     Status: Abnormal   Collection Time: 03/01/18  4:46 AM  Result Value Ref Range   Sodium 139 135 - 145 mmol/L   Potassium 4.6 3.5 - 5.1 mmol/L   Chloride 98 98 - 111 mmol/L   CO2 21 (L) 22 - 32 mmol/L   Glucose, Bld 99 70 - 99 mg/dL   BUN 84 (H) 6 - 20 mg/dL   Creatinine, Ser 10.00 (H) 0.44 - 1.00 mg/dL   Calcium 8.9 8.9 - 10.3 mg/dL   Total Protein 8.3 (H) 6.5 - 8.1 g/dL   Albumin 3.3 (L) 3.5 - 5.0 g/dL   AST 46 (H) 15 - 41 U/L   ALT 31 0 - 44 U/L   Alkaline Phosphatase 290 (H) 38 - 126 U/L   Total Bilirubin 3.7 (H) 0.3 - 1.2 mg/dL   GFR calc non Af Amer 5 (L) >60 mL/min   GFR calc Af Amer 5 (L) >60 mL/min    Comment: (NOTE) The eGFR has been calculated using the CKD EPI equation. This calculation has not been validated in all clinical situations. eGFR's persistently <60 mL/min signify possible Chronic Kidney Disease.    Anion gap 20 (H) 5 - 15    Comment: Performed at [facility], [address] [place], [place] [address]  Ammonia     Status: Abnormal   Collection Time: 03/01/18  4:46 AM  Result Value Ref Range   Ammonia 127 (H) 9 - 35 umol/L    Comment: Performed at [facility], [address], [place], [place] [address]  Protime-INR      Status: Abnormal   Collection Time: 03/01/18  8:51 AM  Result Value Ref Range   Prothrombin Time 28.2 (H) 11.4 - 15.2 seconds   INR 2.67     Comment: Performed at [facility], [address], [place], [place] [address]    Ct Abdomen Pelvis Wo Contrast  Result Date: 02/28/2018 CLINICAL DATA:  Patient with altered mental status. EXAM: CT ABDOMEN AND PELVIS WITHOUT CONTRAST TECHNIQUE: Multidetector CT imaging of the abdomen and pelvis was performed following the standard protocol without IV contrast. COMPARISON:  None. FINDINGS: Lower chest: Cardiomegaly. Patchy ground-glass and consolidative opacities within the lower lobes bilaterally. Small bilateral pleural effusions. Hepatobiliary: Liver is low in attenuation compatible with steatosis. Gallbladder is unremarkable. No intrahepatic or extrahepatic biliary ductal dilatation. Pancreas: Calcifications throughout the pancreatic parenchyma. Spleen: Enlarged measuring approximately 15 cm. Adrenals/Urinary Tract: Adrenal glands are unremarkable. Native kidneys are atrophic. Stomach/Bowel: Normal morphology of the stomach. No abnormal bowel wall thickening or evidence for bowel obstruction. No free intraperitoneal air. Vascular/Lymphatic: Normal caliber abdominal aorta. Peripheral calcified atherosclerotic plaque. Bulky retroperitoneal adenopathy. Reference 2.3 cm left periaortic lymph node (image 44; series 2). Reference 1.2 cm aortocaval node (image 43; series 2). Prominent and mildly enlarged lymph nodes extend distally along the aorta and iliac branches. Multiple prominent and enlarged mesenteric lymph nodes are demonstrated including a 1.4 cm node (image 45; series 2) within the small bowel mesentery. Reproductive: Calcified vascularity in the uterus. Other: Moderate volume ascites. Musculoskeletal: Lumbar spine degenerative changes. Soft tissue calcification posterior to the proximal right femur (image 97; series 2). IMPRESSION: 1. Extensive retroperitoneal  and small bowel mesenteric adenopathy. Findings are nonspecific and may potentially be reactive in etiology however the possibility of metastatic disease or lymphoma are considerations. 2. Splenomegaly. 3. Moderate volume ascites. 4. Atrophic native kidneys. 5. Hepatic steatosis. Electronically Signed   By:  Lovey Newcomer M.D.   On: 02/28/2018 18:12   Dg Chest 1 View  Result Date: 02/28/2018 CLINICAL DATA:  Altered mental status. EXAM: CHEST  1 VIEW COMPARISON:  None. FINDINGS: Low lung volumes. Probable elevation of the RIGHT hemidiaphragm. Lungs appear grossly clear. No pneumothorax. Suspected cardiomegaly, difficult to characterize due to the low lung volumes and lordotic patient positioning. Median sternotomy wires appear intact and normally aligned. No acute or suspicious osseous finding. IMPRESSION: Low lung volumes. No evidence of pneumonia or pulmonary edema seen. Probable cardiomegaly. Electronically Signed   By: Franki Cabot M.D.   On: 02/28/2018 17:37   Ct Head Wo Contrast  Result Date: 02/28/2018 CLINICAL DATA:  Fatigue and malaise EXAM: CT HEAD WITHOUT CONTRAST TECHNIQUE: Contiguous axial images were obtained from the base of the skull through the vertex without intravenous contrast. COMPARISON:  None. FINDINGS: Brain: No evidence of acute infarction, hemorrhage, hydrocephalus, extra-axial collection or mass lesion/mass effect. Chronic right occipital lobe infarct is identified with laminar calcifications. There is mild diffuse low-attenuation within the subcortical and periventricular white matter compatible with chronic microvascular disease. Vascular: No hyperdense vessel or unexpected calcification. Skull: Normal. Negative for fracture or focal lesion. Sinuses/Orbits: No acute finding. Other: None. IMPRESSION: 1. No acute intracranial abnormalities. 2. Chronic small vessel ischemic change. 3. Chronic right occipital lobe infarct. Electronically Signed   By: Kerby Moors M.D.   On:  02/28/2018 17:36    Review of Systems  Constitutional: Negative for fever, malaise/fatigue and weight loss.  Respiratory: Positive for shortness of breath.   Cardiovascular: Negative for orthopnea and leg swelling.  Gastrointestinal: Negative for diarrhea, nausea and vomiting.   Blood pressure 113/68, pulse 92, temperature 98 F (36.7 C), temperature source Oral, resp. rate 19, height '5\' 5"'  (1.651 m), weight 86.8 kg, SpO2 97 %. Physical Exam  Constitutional: She is oriented to person, place, and time. No distress.  Eyes: No scleral icterus.  Neck: No JVD present.  Cardiovascular: Normal rate and regular rhythm.  Murmur heard. Respiratory: No respiratory distress. She has no wheezes. She has rales.  GI: She exhibits no distension. There is no tenderness.  Musculoskeletal: She exhibits no edema.  Neurological: She is alert and oriented to person, place, and time.    Assessment/Plan: 1] end-stage renal disease: Patient had some problem with her fistula and because of that she has missed a couple of her treatment.  After her fistula was declotted patient went for dialysis yesterday and the dialysis was discontinued after 2 hours because of increased restlessness, confusion and yelling.  Presently patient denies any nausea or vomiting. 2] confusion: At this moment etiology not clear but thought to be secondary to medications/hepatic encephalopathy as her ammonia level was high.  At this moment she has returned to her baseline. 3] history of hepatitis C infection and liver disease 4] hypertension: Her blood pressure is reasonably controlled 5] anemia: Her hemoglobin is within our target goal 6] history of mitral valve replacement patient presently on Coumadin as an outpatient. 7] history of chronic pain syndrome. Plan: 1] we will dialyze patient today for 3 hours 2] we will try to remove about 2 L if systolic blood pressure remains above 90 3] we will check her CBC and renal panel in the  morning.  Brennon Otterness S 03/01/2018, 9:23 AM

## 2018-03-01 NOTE — Procedures (Signed)
     HEMODIALYSIS TREATMENT NOTE:  3 hour heparin-free dialysis completed via left upper arm AVF (15g ante/retrograde). Goal met: 2 liters removed without interruption in ultrafiltration.  All blood was returned and hemostasis was achieved in 15 minutes.   Rockwell Alexandria, RN

## 2018-03-01 NOTE — Progress Notes (Signed)
ANTICOAGULATION CONSULT NOTE - Initial Consult  Pharmacy Consult for: warfarin dosing Indication: mechanical heart valve  Allergies  Allergen Reactions  . Bee Venom Anaphylaxis    Note - tolerates medihoney w/no reaction  . Topiramate Hives, Shortness Of Breath and Swelling  . Azithromycin Itching and Rash  . Gentamicin Rash  . Nitroglycerin Other (See Comments)    SEVERE ALLERGY Unclear. Cant remember. " vomited black and nearly died/low blood pressure"   . Nsaids Other (See Comments)  . Vancomycin Itching, Rash and Swelling    Per pt report   . Asa [Aspirin] Itching  . Ondansetron Hcl Hives  . Ibuprofen Itching, Swelling and Rash  . Penicillins Itching, Swelling and Rash  . Sulfamethoxazole-Trimethoprim Rash    Patient Measurements: Height: 5\' 5"  (165.1 cm) Weight: 191 lb 5.8 oz (86.8 kg) IBW/kg (Calculated) : 57    Vital Signs: Temp: 98 F (36.7 C) (10/13 0716) Temp Source: Oral (10/13 0716) BP: 114/73 (10/13 0929) Pulse Rate: 93 (10/13 0929)  Labs: Recent Labs    02/28/18 1521 03/01/18 0446 03/01/18 0851  HGB 11.4* 11.3*  --   HCT 35.8* 35.2*  --   PLT 120* 129*  --   LABPROT 38.1*  --  28.2*  INR 3.92  --  2.67  CREATININE 8.95* 10.00*  --   TROPONINI 0.07*  --   --     Estimated Creatinine Clearance: 9.1 mL/min (A) (by C-G formula based on SCr of 10 mg/dL (H)).   Medical History: Past Medical History:  Diagnosis Date  . Anemia   . Anxiety   . Ascites   . Bacteremia due to group B Streptococcus 10/2017   [facility] admit  . CHF (congestive heart failure) ([facility])   . Chronic anticoagulation    coumadin  . Chronic edema    RLE>LLE  . Chronic pain syndrome   . Dysphagia   . Endocarditis 10/2017   [facility] admit  . ESRD on hemodialysis ([facility])   . Headache disorder   . Hemodialysis patient ([facility])   . High risk medication use    opiods, benzos  . Hypertension   . Hypertriglyceridemia   . IgA nephropathy   . IgA nephropathy   . IVDU (intravenous  drug user) 10/2017   suspected, per [facility] admission notes  . Obesity (BMI 30-39.9)   . On home O2    3L N/C  . Positive hepatitis C antibody test   . Tricuspid regurgitation       Assessment: Pharmacy consulted to dose warfarin for this 27 yof on chronic anticoagulation with warfarin. Patient was on alternating dose of warfarin 4mg  and 5mg  daily.  INR of 2.67 is within desired therapeutic goal range.  Goal of Therapy:  INR 2-3 Monitor platelets by anticoagulation protocol: Yes   Plan:  Give warfarin 4mg  x1 dose tonight. Pharmacy will continue to monitor appropriate labs and for signs and symptoms of bleeding.     Tama High, Pharm. D. Clinical Pharmacist 03/01/2018 10:54 AM

## 2018-03-01 NOTE — Progress Notes (Signed)
PROGRESS NOTE    Marilyn Hanson  ZOX:[id number] DOB: [birth year] DOA: 02/28/2018 PCP: System, Pcp Not In   Brief Narrative:  Per HPI by Dr. Adrian Blackwater on 10/12: Marilyn Hanson is a 27 y.o. 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  Patient also has chronic pain and is on 2 mg of Dilaudid every 6 hours and OxyContin 10 mg every 12 hours.  Patient has had difficulty with fistula over the past week and has not been to dialysis.  She went to dialysis on Thursday but was unable to get it due to the inability to access her fistula.  She went to the vascular center yesterday, who determined that her fistula was patent.  That evening, the patient became aggressive, argumentative, and disoriented.  She finally settled down and went to sleep.  Patient had to be woken to go to dialysis this morning and initially refused, but her sister and brother convinced her to come.  During dialysis, the patient began to thrash around the bed and began to yell.  She was taken off from dialysis after being on it for 2 hours and her family was instructed to bring her to emergency department for evaluation.  Here, the patient was more lethargic, but became arousable after a dose of Narcan.  She recalls certain events over the past 24 hours but not others.  Her symptoms are currently improved with Narcan.  No other palliating or provoking factors.  She denies fevers, chills, nausea, vomiting, diarrhea.  She does have some loose stools.  She has been taking her pain medicine and Klonopin as prescribed.  Of note, she was recently hospitalized at [facility] in July and was diagnosed with C. difficile, GBS endocarditis.  She has completed her antibiotics.  She has been admitted with acute encephalopathy that is multifactorial in nature with hepatic and renal  components as well as medication overdose.  Assessment & Plan:   Principal Problem:   Acute encephalopathy Active Problems:   Hypertension   IgA nephropathy   ESRD on hemodialysis (HCC)   Positive hepatitis C antibody test   Chronic anticoagulation   Mechanical heart valve present   Metabolic acidosis   1. Acute toxic encephalopathy.  Appears to mostly be related to use of narcotics and benzodiazepines which appear to have built up in her system due to poor dialysis over the last week.  Narcan has certainly helped and will resume some immediate acting low-dose oxycodone to assist with pain management for now until dialysis has been performed.  Continue lactulose as scheduled and repeat levels in a.m.  Maintain on neurochecks.  She is already vastly improved and I would anticipate discharge by a.m. if no further symptoms. 2. Mild nausea and vomiting.  Likely due to some uremia.  Should improve with hemodialysis which is scheduled for today. 3. End-stage renal disease on hemodialysis secondary to IgA nephropathy.  Appreciate nephrology consultation with plans for dialysis today. 4. Hypertension.  Continue antihypertensives.  Currently well controlled. 5. Liver disease secondary to hepatitis C.  Continue to monitor INR and ammonia levels.  Maintain on lactulose as noted above. 6. Mechanical heart valve with chronic anticoagulation.  Appreciate pharmacy consultation for Coumadin management.   DVT prophylaxis:Coumadin Code Status: Full Family Communication: None currently at bedside Disposition Plan: Plan for dialysis today and continue monitoring for another 24 hours with  anticipated discharge in a.m. if dialysis well-tolerated.   Consultants:   Nephrology  Procedures:   None  Antimicrobials:   None   Subjective: Patient seen and evaluated today with some nausea and vomiting noted this morning.  She is apparently feeling much better after receiving Narcan yesterday.  She is  more alert and interactive.  No other acute complaints or concerns currently noted.  Objective: Vitals:   03/01/18 0716 03/01/18 0800 03/01/18 0928 03/01/18 0929  BP: 105/69 113/68 114/73 114/73  Pulse: 81 92 95 93  Resp: 19   (!) 24  Temp: 98 F (36.7 C)     TempSrc: Oral     SpO2: 98% 97%  99%  Weight:      Height:       No intake or output data in the 24 hours ending 03/01/18 0941 Filed Weights   02/28/18 1436 02/28/18 2138 03/01/18 0400  Weight: 99.8 kg 85.5 kg 86.8 kg    Examination:  General exam: Appears calm and comfortable  Respiratory system: Clear to auscultation. Respiratory effort normal. Cardiovascular system: S1 & S2 heard, RRR. No JVD, murmurs, rubs, gallops or clicks. No pedal edema. Gastrointestinal system: Abdomen is nondistended, soft and nontender. No organomegaly or masses felt. Normal bowel sounds heard. Central nervous system: Alert and oriented. No focal neurological deficits. Extremities: Symmetric 5 x 5 power. Skin: No rashes, lesions or ulcers Psychiatry: Judgement and insight appear normal. Mood & affect appropriate.     Data Reviewed: I have personally reviewed following labs and imaging studies  CBC: Recent Labs  Lab 02/28/18 1521 03/01/18 0446  WBC 4.2 4.7  NEUTROABS 2.5  --   HGB 11.4* 11.3*  HCT 35.8* 35.2*  MCV 102.9* 105.1*  PLT 120* 129*   Basic Metabolic Panel: Recent Labs  Lab 02/28/18 1521 03/01/18 0446  NA 133* 139  K 4.5 4.6  CL 95* 98  CO2 17* 21*  GLUCOSE 99 99  BUN 76* 84*  CREATININE 8.95* 10.00*  CALCIUM 8.7* 8.9   GFR: Estimated Creatinine Clearance: 9.1 mL/min (A) (by C-G formula based on SCr of 10 mg/dL (H)). Liver Function Tests: Recent Labs  Lab 02/28/18 1521 03/01/18 0446  AST 50* 46*  ALT 34 31  ALKPHOS 318* 290*  BILITOT 4.3* 3.7*  PROT 8.8* 8.3*  ALBUMIN 3.7 3.3*   Recent Labs  Lab 02/28/18 1521  LIPASE 31   Recent Labs  Lab 02/28/18 1750 03/01/18 0446  AMMONIA 90* 127*    Coagulation Profile: Recent Labs  Lab 02/28/18 1521 03/01/18 0851  INR 3.92 2.67   Cardiac Enzymes: Recent Labs  Lab 02/28/18 1521  TROPONINI 0.07*   BNP (last 3 results) No results for input(s): PROBNP in the last 8760 hours. HbA1C: No results for input(s): HGBA1C in the last 72 hours. CBG: No results for input(s): GLUCAP in the last 168 hours. Lipid Profile: No results for input(s): CHOL, HDL, LDLCALC, TRIG, CHOLHDL, LDLDIRECT in the last 72 hours. Thyroid Function Tests: No results for input(s): TSH, T4TOTAL, FREET4, T3FREE, THYROIDAB in the last 72 hours. Anemia Panel: No results for input(s): VITAMINB12, FOLATE, FERRITIN, TIBC, IRON, RETICCTPCT in the last 72 hours. Sepsis Labs: Recent Labs  Lab 02/28/18 1521 02/28/18 1750  LATICACIDVEN 1.6 1.6    Recent Results (from the past 240 hour(s))  MRSA PCR Screening     Status: None   Collection Time: 02/28/18  9:36 PM  Result Value Ref Range Status   MRSA by PCR NEGATIVE NEGATIVE Final  Comment:        The GeneXpert MRSA Assay (FDA approved for NASAL specimens only), is one component of a comprehensive MRSA colonization surveillance program. It is not intended to diagnose MRSA infection nor to guide or monitor treatment for MRSA infections. Performed at [facility], [address], [place], [place] [address]          Radiology Studies: Ct Abdomen Pelvis Wo Contrast  Result Date: 02/28/2018 CLINICAL DATA:  Patient with altered mental status. EXAM: CT ABDOMEN AND PELVIS WITHOUT CONTRAST TECHNIQUE: Multidetector CT imaging of the abdomen and pelvis was performed following the standard protocol without IV contrast. COMPARISON:  None. FINDINGS: Lower chest: Cardiomegaly. Patchy ground-glass and consolidative opacities within the lower lobes bilaterally. Small bilateral pleural effusions. Hepatobiliary: Liver is low in attenuation compatible with steatosis. Gallbladder is unremarkable. No intrahepatic or  extrahepatic biliary ductal dilatation. Pancreas: Calcifications throughout the pancreatic parenchyma. Spleen: Enlarged measuring approximately 15 cm. Adrenals/Urinary Tract: Adrenal glands are unremarkable. Native kidneys are atrophic. Stomach/Bowel: Normal morphology of the stomach. No abnormal bowel wall thickening or evidence for bowel obstruction. No free intraperitoneal air. Vascular/Lymphatic: Normal caliber abdominal aorta. Peripheral calcified atherosclerotic plaque. Bulky retroperitoneal adenopathy. Reference 2.3 cm left periaortic lymph node (image 44; series 2). Reference 1.2 cm aortocaval node (image 43; series 2). Prominent and mildly enlarged lymph nodes extend distally along the aorta and iliac branches. Multiple prominent and enlarged mesenteric lymph nodes are demonstrated including a 1.4 cm node (image 45; series 2) within the small bowel mesentery. Reproductive: Calcified vascularity in the uterus. Other: Moderate volume ascites. Musculoskeletal: Lumbar spine degenerative changes. Soft tissue calcification posterior to the proximal right femur (image 97; series 2). IMPRESSION: 1. Extensive retroperitoneal and small bowel mesenteric adenopathy. Findings are nonspecific and may potentially be reactive in etiology however the possibility of metastatic disease or lymphoma are considerations. 2. Splenomegaly. 3. Moderate volume ascites. 4. Atrophic native kidneys. 5. Hepatic steatosis. Electronically Signed   By: Annia Belt M.D.   On: 02/28/2018 18:12   Dg Chest 1 View  Result Date: 02/28/2018 CLINICAL DATA:  Altered mental status. EXAM: CHEST  1 VIEW COMPARISON:  None. FINDINGS: Low lung volumes. Probable elevation of the RIGHT hemidiaphragm. Lungs appear grossly clear. No pneumothorax. Suspected cardiomegaly, difficult to characterize due to the low lung volumes and lordotic patient positioning. Median sternotomy wires appear intact and normally aligned. No acute or suspicious osseous finding.  IMPRESSION: Low lung volumes. No evidence of pneumonia or pulmonary edema seen. Probable cardiomegaly. Electronically Signed   By: Bary Richard M.D.   On: 02/28/2018 17:37   Ct Head Wo Contrast  Result Date: 02/28/2018 CLINICAL DATA:  Fatigue and malaise EXAM: CT HEAD WITHOUT CONTRAST TECHNIQUE: Contiguous axial images were obtained from the base of the skull through the vertex without intravenous contrast. COMPARISON:  None. FINDINGS: Brain: No evidence of acute infarction, hemorrhage, hydrocephalus, extra-axial collection or mass lesion/mass effect. Chronic right occipital lobe infarct is identified with laminar calcifications. There is mild diffuse low-attenuation within the subcortical and periventricular white matter compatible with chronic microvascular disease. Vascular: No hyperdense vessel or unexpected calcification. Skull: Normal. Negative for fracture or focal lesion. Sinuses/Orbits: No acute finding. Other: None. IMPRESSION: 1. No acute intracranial abnormalities. 2. Chronic small vessel ischemic change. 3. Chronic right occipital lobe infarct. Electronically Signed   By: Signa Kell M.D.   On: 02/28/2018 17:36        Scheduled Meds: . Chlorhexidine Gluconate Cloth  6 each Topical Q0600  .  lactulose  40 g Oral TID  . metoprolol tartrate  50 mg Oral BID  . pantoprazole  40 mg Oral Daily  . sevelamer carbonate  800 mg Oral TID WC   Continuous Infusions:   LOS: 0 days    Time spent: 30 minutes    Peace Noyes Hoover Brunette, DO [facility] Pager [phone]  If 7PM-7AM, please contact night-coverage [email or web address] Password [email or web address] 03/01/2018, 9:41 AM

## 2018-03-02 DIAGNOSIS — G92 Toxic encephalopathy: Secondary | ICD-10-CM | POA: Diagnosis not present

## 2018-03-02 DIAGNOSIS — G9340 Encephalopathy, unspecified: Secondary | ICD-10-CM | POA: Diagnosis not present

## 2018-03-02 MED ORDER — CLONAZEPAM 0.5 MG PO TABS
0.5000 mg | ORAL_TABLET | Freq: Two times a day (BID) | ORAL | Status: DC
  Administered 2018-03-02: 0.5 mg via ORAL
  Filled 2018-03-02: qty 1

## 2018-03-02 MED ORDER — LACTULOSE 10 GM/15ML PO SOLN: 40.0000 g | Freq: Three times a day (TID) | ORAL | 0 refills | Status: DC

## 2018-03-02 NOTE — Progress Notes (Signed)
Subjective: Interval History: has complaints feeling nervous and agitated.  She denies any difficulty breathing.  Patient presently is asking for her antianxiety medication..  Objective: Vital signs in last 24 hours: Temp:  [97.5 F (36.4 C)-97.9 F (36.6 C)] 97.8 F (36.6 C) (10/13 2345) Pulse Rate:  [68-95] 72 (10/14 0200) Resp:  [15-26] 23 (10/14 0200) BP: (87-121)/(52-84) 113/76 (10/14 0200) SpO2:  [94 %-100 %] 100 % (10/14 0200) Weight:  [86.8 kg] 86.8 kg (10/13 1245) Weight change: -13 kg  Intake/Output from previous day: 10/13 0701 - 10/14 0700 In: 230 [P.O.:230] Out: 2000  Intake/Output this shift: No intake/output data recorded.  General appearance: alert, cooperative and no distress Resp: diminished breath sounds bilaterally Cardio: regular rate and rhythm and systolic murmur: holosystolic 3/6, low pitch at 2nd left intercostal space Extremities: edema Trace edema  Lab Results: Recent Labs    02/28/18 1521 03/01/18 0446  WBC 4.2 4.7  HGB 11.4* 11.3*  HCT 35.8* 35.2*  PLT 120* 129*   BMET:  Recent Labs    02/28/18 1521 03/01/18 0446  NA 133* 139  K 4.5 4.6  CL 95* 98  CO2 17* 21*  GLUCOSE 99 99  BUN 76* 84*  CREATININE 8.95* 10.00*  CALCIUM 8.7* 8.9   No results for input(s): PTH in the last 72 hours. Iron Studies: No results for input(s): IRON, TIBC, TRANSFERRIN, FERRITIN in the last 72 hours.  Studies/Results: Ct Abdomen Pelvis Wo Contrast  Result Date: 02/28/2018 CLINICAL DATA:  Patient with altered mental status. EXAM: CT ABDOMEN AND PELVIS WITHOUT CONTRAST TECHNIQUE: Multidetector CT imaging of the abdomen and pelvis was performed following the standard protocol without IV contrast. COMPARISON:  None. FINDINGS: Lower chest: Cardiomegaly. Patchy ground-glass and consolidative opacities within the lower lobes bilaterally. Small bilateral pleural effusions. Hepatobiliary: Liver is low in attenuation compatible with steatosis. Gallbladder is  unremarkable. No intrahepatic or extrahepatic biliary ductal dilatation. Pancreas: Calcifications throughout the pancreatic parenchyma. Spleen: Enlarged measuring approximately 15 cm. Adrenals/Urinary Tract: Adrenal glands are unremarkable. Native kidneys are atrophic. Stomach/Bowel: Normal morphology of the stomach. No abnormal bowel wall thickening or evidence for bowel obstruction. No free intraperitoneal air. Vascular/Lymphatic: Normal caliber abdominal aorta. Peripheral calcified atherosclerotic plaque. Bulky retroperitoneal adenopathy. Reference 2.3 cm left periaortic lymph node (image 44; series 2). Reference 1.2 cm aortocaval node (image 43; series 2). Prominent and mildly enlarged lymph nodes extend distally along the aorta and iliac branches. Multiple prominent and enlarged mesenteric lymph nodes are demonstrated including a 1.4 cm node (image 45; series 2) within the small bowel mesentery. Reproductive: Calcified vascularity in the uterus. Other: Moderate volume ascites. Musculoskeletal: Lumbar spine degenerative changes. Soft tissue calcification posterior to the proximal right femur (image 97; series 2). IMPRESSION: 1. Extensive retroperitoneal and small bowel mesenteric adenopathy. Findings are nonspecific and may potentially be reactive in etiology however the possibility of metastatic disease or lymphoma are considerations. 2. Splenomegaly. 3. Moderate volume ascites. 4. Atrophic native kidneys. 5. Hepatic steatosis. Electronically Signed   By: Annia Belt M.D.   On: 02/28/2018 18:12   Dg Chest 1 View  Result Date: 02/28/2018 CLINICAL DATA:  Altered mental status. EXAM: CHEST  1 VIEW COMPARISON:  None. FINDINGS: Low lung volumes. Probable elevation of the RIGHT hemidiaphragm. Lungs appear grossly clear. No pneumothorax. Suspected cardiomegaly, difficult to characterize due to the low lung volumes and lordotic patient positioning. Median sternotomy wires appear intact and normally aligned. No  acute or suspicious osseous finding. IMPRESSION: Low lung volumes. No evidence of  pneumonia or pulmonary edema seen. Probable cardiomegaly. Electronically Signed   By: Bary Richard M.D.   On: 02/28/2018 17:37   Ct Head Wo Contrast  Result Date: 02/28/2018 CLINICAL DATA:  Fatigue and malaise EXAM: CT HEAD WITHOUT CONTRAST TECHNIQUE: Contiguous axial images were obtained from the base of the skull through the vertex without intravenous contrast. COMPARISON:  None. FINDINGS: Brain: No evidence of acute infarction, hemorrhage, hydrocephalus, extra-axial collection or mass lesion/mass effect. Chronic right occipital lobe infarct is identified with laminar calcifications. There is mild diffuse low-attenuation within the subcortical and periventricular white matter compatible with chronic microvascular disease. Vascular: No hyperdense vessel or unexpected calcification. Skull: Normal. Negative for fracture or focal lesion. Sinuses/Orbits: No acute finding. Other: None. IMPRESSION: 1. No acute intracranial abnormalities. 2. Chronic small vessel ischemic change. 3. Chronic right occipital lobe infarct. Electronically Signed   By: Signa Kell M.D.   On: 02/28/2018 17:36    I have reviewed the patient's current medications.  Assessment/Plan: 1] confusion: Has improved.  Most likely from a combination of medication and hepatic encephalopathy.  Patient looks somewhat better.  Her ammonia level is high. 2] end-stage renal disease: She is status post hemodialysis yesterday.  Presently she did not have any uremic signs and symptoms. 3] fluid management: No significant sign of fluid overload.  We are able to remove about 2 L yesterday with dialysis.  Presently she denies any difficulty breathing. 4] bone and mineral disorder: Her calcium is in range. 5] anemia: Her hemoglobin is within our target goal 6] hypertension: Her blood pressure is reasonably controlled 7] status post mitral valve replacement    LOS: 0  days   Deavon Podgorski S 03/02/2018,8:28 AM

## 2018-03-02 NOTE — Discharge Summary (Signed)
Physician Discharge Summary  Marilyn Hanson ZOX:[id number] DOB: November 07, 1990 DOA: 02/28/2018  PCP: System, Pcp Not In  Admit date: 02/28/2018  Discharge date: 03/02/2018  Admitted From:Home  Disposition:  Home  Recommendations for Outpatient Follow-up:  1. Follow up with PCP in 1-2 weeks 2. Continue dialysis as previously scheduled on Tuesdays, Thursdays, and Saturdays  Home Health: None  Equipment/Devices: None  Discharge Condition: Stable  CODE STATUS: Full  Diet recommendation: Heart Healthy/renal  Brief/Interim Summary:  Per HPI by Dr. 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 Patient also has chronic pain and is on 2 mg of Dilaudid every 6 hours and OxyContin 10 mg every 12 hours. Patient has had difficulty with fistula over the past week and has not been to dialysis. She went to dialysis on Thursday but was unable to get it due to the inability to access her fistula. She went to the vascular center yesterday, who determined that her fistula was patent. That evening, the patient became aggressive, argumentative, and disoriented. She finally settled down and went to sleep. Patient had to be woken to go to dialysis this morning and initially refused, but her sister and brother convinced her to come. During dialysis, the patient began to thrash around the bed and began to yell. She was taken off from dialysis after being on it for 2 hours and her family was instructed to bring her to emergency department for evaluation. Here, the patient was more lethargic, but became arousable after a dose of Narcan. She recalls certain events over the past 24 hours but not others. Her symptoms are currently improved with Narcan. No other palliating or provoking factors.  She denies fevers, chills, nausea, vomiting, diarrhea. She does have some loose stools. She has been taking her pain medicine and Klonopin as prescribed.  Of note, she was recently hospitalized at [facility] in July and was diagnosed with C. difficile, GBS endocarditis. She has completed her antibiotics.  She had been admitted with acute encephalopathy that was multifactorial in nature with hepatic and renal components as well as medication overdose.  She primarily had an issue with her hemodialysis over the last couple sessions and therefore built-up medication toxicity in her system.  She underwent a hemodialysis session while hospitalized on 10/13 and tolerated this quite well with 2 L of fluid removed.  She was also noted to have some hyperammonemia for which she has been started on lactulose on discharge.  She will be discharged with her usual home narcotics and will continue on her hemodialysis as previously scheduled.  She has no further symptomatology and is back to her usual baseline level of functioning per mother at bedside.  No other acute events noted during the course of this admission.  Discharge Diagnoses:  Principal Problem:   Acute encephalopathy Active Problems:   Hypertension   IgA nephropathy   ESRD on hemodialysis (HCC)   Positive hepatitis C antibody test   Chronic anticoagulation   Mechanical heart valve present   Metabolic acidosis    Discharge Instructions  Discharge Instructions    Diet - low sodium heart healthy   Complete by:  As directed    Increase activity slowly   Complete by:  As directed      Allergies as of 03/02/2018      Reactions  Bee Venom Anaphylaxis   Note - tolerates medihoney w/no reaction   Topiramate Hives, Shortness Of Breath, Swelling   Azithromycin Itching, Rash   Gentamicin Rash   Nitroglycerin Other (See Comments)   SEVERE ALLERGY Unclear. Cant remember. " vomited black and nearly died/low blood pressure"   Nsaids Other (See  Comments)   Vancomycin Itching, Rash, Swelling   Per pt report   Asa [aspirin] Itching   Ondansetron Hcl Hives   Ibuprofen Itching, Swelling, Rash   Penicillins Itching, Swelling, Rash   Sulfamethoxazole-trimethoprim Rash      Medication List    TAKE these medications   AURYXIA 1 GM 210 MG(Fe) tablet Generic drug:  ferric citrate Take 2 tablets by mouth 3 (three) times daily with meals.   clonazePAM 0.5 MG tablet Commonly known as:  KLONOPIN Take 0.5 mg by mouth 2 (two) times daily.   diphenhydrAMINE 25 MG tablet Commonly known as:  BENADRYL Take 25-50 mg by mouth every 6 (six) hours as needed for itching.   epoetin alfa 2000 UNIT/ML injection Commonly known as:  EPOGEN,PROCRIT Inject 2,000 Units into the skin Every Tuesday,Thursday,and Saturday with dialysis.   HYDROmorphone 2 MG tablet Commonly known as:  DILAUDID Take 2 mg by mouth every 6 (six) hours as needed for severe pain.   lactulose 10 GM/15ML solution Commonly known as:  CHRONULAC Take 60 mLs (40 g total) by mouth 3 (three) times daily.   metoprolol tartrate 50 MG tablet Commonly known as:  LOPRESSOR Take 1 tablet (50 mg total) by mouth 2 (two) times daily.   naloxone 0.4 MG/ML injection Commonly known as:  NARCAN Place 0.4 mg into the nose once as needed (for opiod overdose).   ondansetron 8 MG disintegrating tablet Commonly known as:  ZOFRAN-ODT Take 8 mg by mouth every 8 (eight) hours as needed for nausea or vomiting.   OXYCONTIN 10 mg 12 hr tablet Generic drug:  oxyCODONE Take 10 mg by mouth every 12 (twelve) hours.   OXYGEN Inhale 4 L into the lungs daily.   pantoprazole 40 MG tablet Commonly known as:  PROTONIX Take 40 mg by mouth daily.   sevelamer 800 MG tablet Commonly known as:  RENAGEL Take 800 mg by mouth 3 (three) times daily with meals.   sevelamer carbonate 0.8 g Pack packet Commonly known as:  RENVELA Take 0.8 g by mouth daily. To take with largest meal of the day    traZODone 100 MG tablet Commonly known as:  DESYREL Take 50 mg by mouth at bedtime.   warfarin 4 MG tablet Commonly known as:  COUMADIN Take 4-5 mg by mouth See admin instructions. Alternates taking 4mg  with 5mg  daily in the evening       Allergies  Allergen Reactions  . Bee Venom Anaphylaxis    Note - tolerates medihoney w/no reaction  . Topiramate Hives, Shortness Of Breath and Swelling  . Azithromycin Itching and Rash  . Gentamicin Rash  . Nitroglycerin Other (See Comments)    SEVERE ALLERGY Unclear. Cant remember. " vomited black and nearly died/low blood pressure"   . Nsaids Other (See Comments)  . Vancomycin Itching, Rash and Swelling    Per pt report   . Asa [Aspirin] Itching  . Ondansetron Hcl Hives  . Ibuprofen Itching, Swelling and Rash  . Penicillins Itching, Swelling and Rash  . Sulfamethoxazole-Trimethoprim Rash    Consultations:  Nephrology   Procedures/Studies: Ct Abdomen Pelvis Wo Contrast  Result Date: 02/28/2018 CLINICAL DATA:  Patient with  altered mental status. EXAM: CT ABDOMEN AND PELVIS WITHOUT CONTRAST TECHNIQUE: Multidetector CT imaging of the abdomen and pelvis was performed following the standard protocol without IV contrast. COMPARISON:  None. FINDINGS: Lower chest: Cardiomegaly. Patchy ground-glass and consolidative opacities within the lower lobes bilaterally. Small bilateral pleural effusions. Hepatobiliary: Liver is low in attenuation compatible with steatosis. Gallbladder is unremarkable. No intrahepatic or extrahepatic biliary ductal dilatation. Pancreas: Calcifications throughout the pancreatic parenchyma. Spleen: Enlarged measuring approximately 15 cm. Adrenals/Urinary Tract: Adrenal glands are unremarkable. Native kidneys are atrophic. Stomach/Bowel: Normal morphology of the stomach. No abnormal bowel wall thickening or evidence for bowel obstruction. No free intraperitoneal air. Vascular/Lymphatic: Normal caliber abdominal aorta.  Peripheral calcified atherosclerotic plaque. Bulky retroperitoneal adenopathy. Reference 2.3 cm left periaortic lymph node (image 44; series 2). Reference 1.2 cm aortocaval node (image 43; series 2). Prominent and mildly enlarged lymph nodes extend distally along the aorta and iliac branches. Multiple prominent and enlarged mesenteric lymph nodes are demonstrated including a 1.4 cm node (image 45; series 2) within the small bowel mesentery. Reproductive: Calcified vascularity in the uterus. Other: Moderate volume ascites. Musculoskeletal: Lumbar spine degenerative changes. Soft tissue calcification posterior to the proximal right femur (image 97; series 2). IMPRESSION: 1. Extensive retroperitoneal and small bowel mesenteric adenopathy. Findings are nonspecific and may potentially be reactive in etiology however the possibility of metastatic disease or lymphoma are considerations. 2. Splenomegaly. 3. Moderate volume ascites. 4. Atrophic native kidneys. 5. Hepatic steatosis. Electronically Signed   By: Annia Belt M.D.   On: 02/28/2018 18:12   Dg Chest 1 View  Result Date: 02/28/2018 CLINICAL DATA:  Altered mental status. EXAM: CHEST  1 VIEW COMPARISON:  None. FINDINGS: Low lung volumes. Probable elevation of the RIGHT hemidiaphragm. Lungs appear grossly clear. No pneumothorax. Suspected cardiomegaly, difficult to characterize due to the low lung volumes and lordotic patient positioning. Median sternotomy wires appear intact and normally aligned. No acute or suspicious osseous finding. IMPRESSION: Low lung volumes. No evidence of pneumonia or pulmonary edema seen. Probable cardiomegaly. Electronically Signed   By: Bary Richard M.D.   On: 02/28/2018 17:37   Ct Head Wo Contrast  Result Date: 02/28/2018 CLINICAL DATA:  Fatigue and malaise EXAM: CT HEAD WITHOUT CONTRAST TECHNIQUE: Contiguous axial images were obtained from the base of the skull through the vertex without intravenous contrast. COMPARISON:  None.  FINDINGS: Brain: No evidence of acute infarction, hemorrhage, hydrocephalus, extra-axial collection or mass lesion/mass effect. Chronic right occipital lobe infarct is identified with laminar calcifications. There is mild diffuse low-attenuation within the subcortical and periventricular white matter compatible with chronic microvascular disease. Vascular: No hyperdense vessel or unexpected calcification. Skull: Normal. Negative for fracture or focal lesion. Sinuses/Orbits: No acute finding. Other: None. IMPRESSION: 1. No acute intracranial abnormalities. 2. Chronic small vessel ischemic change. 3. Chronic right occipital lobe infarct. Electronically Signed   By: Signa Kell M.D.   On: 02/28/2018 17:36     Discharge Exam: Vitals:   03/02/18 0851 03/02/18 0923  BP:  128/83  Pulse:  89  Resp:    Temp:    SpO2: 98%    Vitals:   03/02/18 0100 03/02/18 0200 03/02/18 0851 03/02/18 0923  BP: 114/81 113/76  128/83  Pulse: 70 72  89  Resp: 15 (!) 23    Temp:      TempSrc:      SpO2: 100% 100% 98%   Weight:      Height:        General: Pt is  alert, awake, not in acute distress Cardiovascular: RRR, S1/S2 +, no rubs, no gallops Respiratory: CTA bilaterally, no wheezing, no rhonchi Abdominal: Soft, NT, ND, bowel sounds + Extremities: no edema, no cyanosis    The results of significant diagnostics from this hospitalization (including imaging, microbiology, ancillary and laboratory) are listed below for reference.     Microbiology: Recent Results (from the past 240 hour(s))  MRSA PCR Screening     Status: None   Collection Time: 02/28/18  9:36 PM  Result Value Ref Range Status   MRSA by PCR NEGATIVE NEGATIVE Final    Comment:        The GeneXpert MRSA Assay (FDA approved for NASAL specimens only), is one component of a comprehensive MRSA colonization surveillance program. It is not intended to diagnose MRSA infection nor to guide or monitor treatment for MRSA  infections. Performed at [facility], [address] [place], [place] [address]      Labs: BNP (last 3 results) No results for input(s): BNP in the last 8760 hours. Basic Metabolic Panel: Recent Labs  Lab 02/28/18 1521 03/01/18 0446  NA 133* 139  K 4.5 4.6  CL 95* 98  CO2 17* 21*  GLUCOSE 99 99  BUN 76* 84*  CREATININE 8.95* 10.00*  CALCIUM 8.7* 8.9   Liver Function Tests: Recent Labs  Lab 02/28/18 1521 03/01/18 0446  AST 50* 46*  ALT 34 31  ALKPHOS 318* 290*  BILITOT 4.3* 3.7*  PROT 8.8* 8.3*  ALBUMIN 3.7 3.3*   Recent Labs  Lab 02/28/18 1521  LIPASE 31   Recent Labs  Lab 02/28/18 1750 03/01/18 0446  AMMONIA 90* 127*   CBC: Recent Labs  Lab 02/28/18 1521 03/01/18 0446  WBC 4.2 4.7  NEUTROABS 2.5  --   HGB 11.4* 11.3*  HCT 35.8* 35.2*  MCV 102.9* 105.1*  PLT 120* 129*   Cardiac Enzymes: Recent Labs  Lab 02/28/18 1521  TROPONINI 0.07*   BNP: Invalid input(s): POCBNP CBG: No results for input(s): GLUCAP in the last 168 hours. D-Dimer No results for input(s): DDIMER in the last 72 hours. Hgb A1c No results for input(s): HGBA1C in the last 72 hours. Lipid Profile No results for input(s): CHOL, HDL, LDLCALC, TRIG, CHOLHDL, LDLDIRECT in the last 72 hours. Thyroid function studies No results for input(s): TSH, T4TOTAL, T3FREE, THYROIDAB in the last 72 hours.  Invalid input(s): FREET3 Anemia work up No results for input(s): VITAMINB12, FOLATE, FERRITIN, TIBC, IRON, RETICCTPCT in the last 72 hours. Urinalysis No results found for: COLORURINE, APPEARANCEUR, LABSPEC, PHURINE, GLUCOSEU, HGBUR, BILIRUBINUR, KETONESUR, PROTEINUR, UROBILINOGEN, NITRITE, LEUKOCYTESUR Sepsis Labs Invalid input(s): PROCALCITONIN,  WBC,  LACTICIDVEN Microbiology Recent Results (from the past 240 hour(s))  MRSA PCR Screening     Status: None   Collection Time: 02/28/18  9:36 PM  Result Value Ref Range Status   MRSA by PCR NEGATIVE NEGATIVE Final    Comment:         The GeneXpert MRSA Assay (FDA approved for NASAL specimens only), is one component of a comprehensive MRSA colonization surveillance program. It is not intended to diagnose MRSA infection nor to guide or monitor treatment for MRSA infections. Performed at [facility], [address] [place], [place] [address]      Time coordinating discharge: 35 minutes  SIGNED:   Erick Blinks, DO [facility] 03/02/2018, 9:28 AM Pager [phone]  If 7PM-7AM, please contact night-coverage [email or web address] Password [email or web address]

## 2018-03-02 NOTE — Progress Notes (Signed)
Patient alert and oriented x4. No complaints of pain, shortness of breath, chest pain, dizziness, nausea or vomiting. Patient up out of bed to bedside commode, transferred self. Mother in room, emotional support provided. Patient requesting Klonopin which she states she takes at home and is feeling anxious and "about ready to jump out of her skin. Dr. Sherryll Burger notified and klonopin ordered and given with good effect.

## 2018-03-03 LAB — HIV ANTIBODY (ROUTINE TESTING W REFLEX): HIV SCREEN 4TH GENERATION: NONREACTIVE

## 2018-03-06 ENCOUNTER — Other Ambulatory Visit (HOSPITAL_COMMUNITY): Payer: Self-pay

## 2018-03-06 ENCOUNTER — Inpatient Hospital Stay (HOSPITAL_COMMUNITY)
Admission: EM | Admit: 2018-03-06 | Discharge: 2018-03-07 | DRG: 280 | Discharge status: Against Medical Advice | Payer: Medicare Other | Attending: Internal Medicine | Admitting: Internal Medicine

## 2018-03-06 ENCOUNTER — Encounter (HOSPITAL_COMMUNITY): Payer: Self-pay | Admitting: Cardiology

## 2018-03-06 ENCOUNTER — Other Ambulatory Visit: Payer: Self-pay

## 2018-03-06 ENCOUNTER — Emergency Department (HOSPITAL_COMMUNITY): Payer: Medicare Other

## 2018-03-06 ENCOUNTER — Encounter (INDEPENDENT_AMBULATORY_CARE_PROVIDER_SITE_OTHER): Payer: Medicare Other | Admitting: Vascular Surgery

## 2018-03-06 DIAGNOSIS — N2889 Other specified disorders of kidney and ureter: Secondary | ICD-10-CM

## 2018-03-06 DIAGNOSIS — G894 Chronic pain syndrome: Secondary | ICD-10-CM | POA: Diagnosis present

## 2018-03-06 DIAGNOSIS — I081 Rheumatic disorders of both mitral and tricuspid valves: Secondary | ICD-10-CM | POA: Diagnosis present

## 2018-03-06 DIAGNOSIS — N028 Recurrent and persistent hematuria with other morphologic changes: Secondary | ICD-10-CM

## 2018-03-06 DIAGNOSIS — E785 Hyperlipidemia, unspecified: Secondary | ICD-10-CM | POA: Diagnosis present

## 2018-03-06 DIAGNOSIS — E781 Pure hyperglyceridemia: Secondary | ICD-10-CM | POA: Diagnosis present

## 2018-03-06 DIAGNOSIS — R188 Other ascites: Secondary | ICD-10-CM | POA: Diagnosis present

## 2018-03-06 DIAGNOSIS — Z79899 Other long term (current) drug therapy: Secondary | ICD-10-CM

## 2018-03-06 DIAGNOSIS — E669 Obesity, unspecified: Secondary | ICD-10-CM | POA: Diagnosis present

## 2018-03-06 DIAGNOSIS — I071 Rheumatic tricuspid insufficiency: Secondary | ICD-10-CM | POA: Diagnosis not present

## 2018-03-06 DIAGNOSIS — Z952 Presence of prosthetic heart valve: Secondary | ICD-10-CM | POA: Diagnosis not present

## 2018-03-06 DIAGNOSIS — N186 End stage renal disease: Secondary | ICD-10-CM | POA: Diagnosis present

## 2018-03-06 DIAGNOSIS — B1920 Unspecified viral hepatitis C without hepatic coma: Secondary | ICD-10-CM | POA: Diagnosis present

## 2018-03-06 DIAGNOSIS — F17219 Nicotine dependence, cigarettes, with unspecified nicotine-induced disorders: Secondary | ICD-10-CM | POA: Diagnosis present

## 2018-03-06 DIAGNOSIS — R748 Abnormal levels of other serum enzymes: Secondary | ICD-10-CM

## 2018-03-06 DIAGNOSIS — F419 Anxiety disorder, unspecified: Secondary | ICD-10-CM | POA: Diagnosis present

## 2018-03-06 DIAGNOSIS — I151 Hypertension secondary to other renal disorders: Secondary | ICD-10-CM

## 2018-03-06 DIAGNOSIS — Z881 Allergy status to other antibiotic agents status: Secondary | ICD-10-CM

## 2018-03-06 DIAGNOSIS — R112 Nausea with vomiting, unspecified: Secondary | ICD-10-CM | POA: Diagnosis present

## 2018-03-06 DIAGNOSIS — D631 Anemia in chronic kidney disease: Secondary | ICD-10-CM | POA: Diagnosis present

## 2018-03-06 DIAGNOSIS — I471 Supraventricular tachycardia, unspecified: Secondary | ICD-10-CM

## 2018-03-06 DIAGNOSIS — Z888 Allergy status to other drugs, medicaments and biological substances status: Secondary | ICD-10-CM

## 2018-03-06 DIAGNOSIS — Z886 Allergy status to analgesic agent status: Secondary | ICD-10-CM

## 2018-03-06 DIAGNOSIS — R197 Diarrhea, unspecified: Secondary | ICD-10-CM | POA: Diagnosis not present

## 2018-03-06 DIAGNOSIS — Z91030 Bee allergy status: Secondary | ICD-10-CM

## 2018-03-06 DIAGNOSIS — I5032 Chronic diastolic (congestive) heart failure: Secondary | ICD-10-CM | POA: Diagnosis present

## 2018-03-06 DIAGNOSIS — Z79891 Long term (current) use of opiate analgesic: Secondary | ICD-10-CM

## 2018-03-06 DIAGNOSIS — Z992 Dependence on renal dialysis: Secondary | ICD-10-CM | POA: Diagnosis not present

## 2018-03-06 DIAGNOSIS — K761 Chronic passive congestion of liver: Secondary | ICD-10-CM | POA: Diagnosis present

## 2018-03-06 DIAGNOSIS — R778 Other specified abnormalities of plasma proteins: Secondary | ICD-10-CM

## 2018-03-06 DIAGNOSIS — E8779 Other fluid overload: Secondary | ICD-10-CM | POA: Diagnosis not present

## 2018-03-06 DIAGNOSIS — R7989 Other specified abnormal findings of blood chemistry: Secondary | ICD-10-CM

## 2018-03-06 DIAGNOSIS — R791 Abnormal coagulation profile: Secondary | ICD-10-CM | POA: Diagnosis present

## 2018-03-06 DIAGNOSIS — I10 Essential (primary) hypertension: Secondary | ICD-10-CM | POA: Diagnosis present

## 2018-03-06 DIAGNOSIS — I132 Hypertensive heart and chronic kidney disease with heart failure and with stage 5 chronic kidney disease, or end stage renal disease: Secondary | ICD-10-CM | POA: Diagnosis present

## 2018-03-06 DIAGNOSIS — I214 Non-ST elevation (NSTEMI) myocardial infarction: Secondary | ICD-10-CM | POA: Diagnosis present

## 2018-03-06 DIAGNOSIS — R079 Chest pain, unspecified: Secondary | ICD-10-CM

## 2018-03-06 DIAGNOSIS — D539 Nutritional anemia, unspecified: Secondary | ICD-10-CM

## 2018-03-06 DIAGNOSIS — Z7901 Long term (current) use of anticoagulants: Secondary | ICD-10-CM

## 2018-03-06 DIAGNOSIS — J9610 Chronic respiratory failure, unspecified whether with hypoxia or hypercapnia: Secondary | ICD-10-CM | POA: Diagnosis present

## 2018-03-06 DIAGNOSIS — Z880 Allergy status to penicillin: Secondary | ICD-10-CM

## 2018-03-06 DIAGNOSIS — Z9981 Dependence on supplemental oxygen: Secondary | ICD-10-CM

## 2018-03-06 DIAGNOSIS — G9349 Other encephalopathy: Secondary | ICD-10-CM | POA: Diagnosis present

## 2018-03-06 DIAGNOSIS — Z6831 Body mass index (BMI) 31.0-31.9, adult: Secondary | ICD-10-CM

## 2018-03-06 DIAGNOSIS — Z72820 Sleep deprivation: Secondary | ICD-10-CM

## 2018-03-06 LAB — CBC WITH DIFFERENTIAL/PLATELET
Abs Immature Granulocytes: 0.04 10*3/uL (ref 0.00–0.07)
Basophils Absolute: 0.1 10*3/uL (ref 0.0–0.1)
Basophils Relative: 1 %
Eosinophils Absolute: 0.3 10*3/uL (ref 0.0–0.5)
Eosinophils Relative: 5 %
HEMATOCRIT: 37.9 % (ref 36.0–46.0)
HEMOGLOBIN: 11.5 g/dL — AB (ref 12.0–15.0)
IMMATURE GRANULOCYTES: 1 %
LYMPHS ABS: 1.5 10*3/uL (ref 0.7–4.0)
Lymphocytes Relative: 25 %
MCH: 33 pg (ref 26.0–34.0)
MCHC: 30.3 g/dL (ref 30.0–36.0)
MCV: 108.6 fL — AB (ref 80.0–100.0)
Monocytes Absolute: 0.7 10*3/uL (ref 0.1–1.0)
Monocytes Relative: 11 %
NEUTROS PCT: 57 %
NRBC: 0 % (ref 0.0–0.2)
Neutro Abs: 3.4 10*3/uL (ref 1.7–7.7)
Platelets: 133 10*3/uL — ABNORMAL LOW (ref 150–400)
RBC: 3.49 MIL/uL — ABNORMAL LOW (ref 3.87–5.11)
RDW: 16.3 % — ABNORMAL HIGH (ref 11.5–15.5)
WBC: 6 10*3/uL (ref 4.0–10.5)

## 2018-03-06 LAB — CBG MONITORING, ED: Glucose-Capillary: 110 mg/dL — ABNORMAL HIGH (ref 70–99)

## 2018-03-06 LAB — LIPASE, BLOOD: Lipase: 114 U/L — ABNORMAL HIGH (ref 11–51)

## 2018-03-06 LAB — COMPREHENSIVE METABOLIC PANEL
ALT: 22 U/L (ref 0–44)
AST: 34 U/L (ref 15–41)
Albumin: 3.4 g/dL — ABNORMAL LOW (ref 3.5–5.0)
Alkaline Phosphatase: 252 U/L — ABNORMAL HIGH (ref 38–126)
Anion gap: 12 (ref 5–15)
BUN: 43 mg/dL — AB (ref 6–20)
CHLORIDE: 101 mmol/L (ref 98–111)
CO2: 26 mmol/L (ref 22–32)
CREATININE: 5.4 mg/dL — AB (ref 0.44–1.00)
Calcium: 9.4 mg/dL (ref 8.9–10.3)
GFR calc non Af Amer: 10 mL/min — ABNORMAL LOW (ref >60)
GFR, EST AFRICAN AMERICAN: 11 mL/min — AB (ref >60)
Glucose, Bld: 109 mg/dL — ABNORMAL HIGH (ref 70–99)
POTASSIUM: 3.7 mmol/L (ref 3.5–5.1)
SODIUM: 139 mmol/L (ref 135–145)
Total Bilirubin: 3.4 mg/dL — ABNORMAL HIGH (ref 0.3–1.2)
Total Protein: 8.4 g/dL — ABNORMAL HIGH (ref 6.5–8.1)

## 2018-03-06 LAB — TROPONIN I
TROPONIN I: 1.72 ng/mL — AB (ref <0.03)
Troponin I: 2.39 ng/mL (ref <0.03)
Troponin I: 2.01 ng/mL (ref <0.03)

## 2018-03-06 LAB — HEPARIN LEVEL (UNFRACTIONATED): Heparin Unfractionated: <0.1 IU/mL — ABNORMAL LOW (ref 0.30–0.70)

## 2018-03-06 LAB — PROTIME-INR
INR: 1.8
Prothrombin Time: 20.6 seconds — ABNORMAL HIGH (ref 11.4–15.2)

## 2018-03-06 LAB — I-STAT CG4 LACTIC ACID, ED: Lactic Acid, Venous: 1.45 mmol/L (ref 0.5–1.9)

## 2018-03-06 MED ORDER — LORAZEPAM 2 MG/ML IJ SOLN
0.5000 mg | Freq: Once | INTRAMUSCULAR | Status: AC
  Administered 2018-03-06: 0.5 mg via INTRAVENOUS
  Filled 2018-03-06: qty 1

## 2018-03-06 MED ORDER — HEPARIN (PORCINE) IN NACL 100-0.45 UNIT/ML-% IJ SOLN
1350.0000 [IU]/h | INTRAMUSCULAR | Status: DC
  Administered 2018-03-07: 1200 [IU]/h via INTRAVENOUS
  Filled 2018-03-06: qty 250

## 2018-03-06 MED ORDER — PROMETHAZINE HCL 25 MG/ML IJ SOLN: 6.2500 mg | Freq: Four times a day (QID) | INTRAMUSCULAR | Status: DC | PRN

## 2018-03-06 MED ORDER — ACETAMINOPHEN 325 MG PO TABS: 650.0000 mg | ORAL_TABLET | Freq: Four times a day (QID) | ORAL | Status: DC | PRN

## 2018-03-06 MED ORDER — HYDROMORPHONE HCL 2 MG PO TABS
ORAL_TABLET | ORAL | Status: AC
  Filled 2018-03-06: qty 1

## 2018-03-06 MED ORDER — SENNOSIDES-DOCUSATE SODIUM 8.6-50 MG PO TABS: 1.0000 | ORAL_TABLET | Freq: Every evening | ORAL | Status: DC | PRN

## 2018-03-06 MED ORDER — HYDROMORPHONE HCL 1 MG/ML IJ SOLN
1.0000 mg | INTRAMUSCULAR | Status: DC | PRN
  Administered 2018-03-06 – 2018-03-07 (×3): 1 mg via INTRAVENOUS
  Filled 2018-03-06 (×3): qty 1

## 2018-03-06 MED ORDER — HEPARIN (PORCINE) IN NACL 100-0.45 UNIT/ML-% IJ SOLN
1200.0000 [IU]/h | INTRAMUSCULAR | Status: DC
  Administered 2018-03-06: 900 [IU]/h via INTRAVENOUS
  Filled 2018-03-06: qty 250

## 2018-03-06 MED ORDER — CHLORHEXIDINE GLUCONATE CLOTH 2 % EX PADS: 6.0000 | MEDICATED_PAD | Freq: Every day | CUTANEOUS | Status: DC

## 2018-03-06 MED ORDER — OXYCODONE HCL ER 10 MG PO T12A
10.0000 mg | EXTENDED_RELEASE_TABLET | Freq: Two times a day (BID) | ORAL | Status: DC
  Administered 2018-03-07: 10 mg via ORAL
  Filled 2018-03-06: qty 1

## 2018-03-06 MED ORDER — ONDANSETRON HCL 4 MG/2ML IJ SOLN: 4.0000 mg | Freq: Four times a day (QID) | INTRAMUSCULAR | Status: DC | PRN

## 2018-03-06 MED ORDER — METOPROLOL TARTRATE 50 MG PO TABS
50.0000 mg | ORAL_TABLET | Freq: Two times a day (BID) | ORAL | Status: DC
  Administered 2018-03-06 – 2018-03-07 (×3): 50 mg via ORAL
  Filled 2018-03-06 (×3): qty 1

## 2018-03-06 MED ORDER — SEVELAMER CARBONATE 800 MG PO TABS
800.0000 mg | ORAL_TABLET | Freq: Three times a day (TID) | ORAL | Status: DC
  Administered 2018-03-06 – 2018-03-07 (×2): 800 mg via ORAL
  Filled 2018-03-06 (×2): qty 1

## 2018-03-06 MED ORDER — LACTULOSE 10 GM/15ML PO SOLN: 40.0000 g | Freq: Three times a day (TID) | ORAL | Status: DC

## 2018-03-06 MED ORDER — HALOPERIDOL LACTATE 5 MG/ML IJ SOLN
2.0000 mg | Freq: Once | INTRAMUSCULAR | Status: AC
  Administered 2018-03-06: 2 mg via INTRAVENOUS
  Filled 2018-03-06: qty 1

## 2018-03-06 MED ORDER — SODIUM CHLORIDE 0.9 % IV SOLN: 250.0000 mL | INTRAVENOUS | Status: DC | PRN

## 2018-03-06 MED ORDER — DIPHENHYDRAMINE HCL 50 MG/ML IJ SOLN
25.0000 mg | Freq: Once | INTRAMUSCULAR | Status: AC
  Administered 2018-03-06: 25 mg via INTRAVENOUS
  Filled 2018-03-06: qty 1

## 2018-03-06 MED ORDER — ACETAMINOPHEN 650 MG RE SUPP: 650.0000 mg | Freq: Four times a day (QID) | RECTAL | Status: DC | PRN

## 2018-03-06 MED ORDER — ADENOSINE 6 MG/2ML IV SOLN
6.0000 mg | Freq: Once | INTRAVENOUS | Status: AC
  Administered 2018-03-06: 12 mg via INTRAVENOUS
  Filled 2018-03-06: qty 2

## 2018-03-06 MED ORDER — ADENOSINE 6 MG/2ML IV SOLN
INTRAVENOUS | Status: AC
  Administered 2018-03-06: 6 mg via INTRAVENOUS
  Filled 2018-03-06: qty 2

## 2018-03-06 MED ORDER — HEPARIN BOLUS VIA INFUSION
4000.0000 [IU] | Freq: Once | INTRAVENOUS | Status: AC
  Administered 2018-03-06: 4000 [IU] via INTRAVENOUS

## 2018-03-06 MED ORDER — SODIUM CHLORIDE 0.9% FLUSH: 3.0000 mL | INTRAVENOUS | Status: DC | PRN

## 2018-03-06 MED ORDER — SODIUM CHLORIDE 0.9% FLUSH
3.0000 mL | Freq: Two times a day (BID) | INTRAVENOUS | Status: DC
  Administered 2018-03-06: 3 mL via INTRAVENOUS

## 2018-03-06 MED ORDER — LORAZEPAM 2 MG/ML IJ SOLN
1.0000 mg | Freq: Once | INTRAMUSCULAR | Status: AC
  Administered 2018-03-06: 1 mg via INTRAVENOUS
  Filled 2018-03-06: qty 1

## 2018-03-06 MED ORDER — ADENOSINE 6 MG/2ML IV SOLN
INTRAVENOUS | Status: AC
  Filled 2018-03-06: qty 2

## 2018-03-06 MED ORDER — PROMETHAZINE HCL 25 MG/ML IJ SOLN: 6.2500 mg | Freq: Three times a day (TID) | INTRAMUSCULAR | Status: DC | PRN

## 2018-03-06 MED ORDER — HYDROMORPHONE HCL 2 MG PO TABS
2.0000 mg | ORAL_TABLET | Freq: Four times a day (QID) | ORAL | Status: DC | PRN
  Administered 2018-03-06: 2 mg via ORAL
  Filled 2018-03-06: qty 1

## 2018-03-06 MED ORDER — TRAZODONE HCL 50 MG PO TABS: 50.0000 mg | ORAL_TABLET | Freq: Every day | ORAL | Status: DC

## 2018-03-06 MED ORDER — CLONAZEPAM 0.5 MG PO TABS
0.5000 mg | ORAL_TABLET | Freq: Two times a day (BID) | ORAL | Status: DC
  Administered 2018-03-06 – 2018-03-07 (×2): 0.5 mg via ORAL
  Filled 2018-03-06 (×2): qty 1

## 2018-03-06 MED ORDER — ONDANSETRON HCL 4 MG PO TABS: 4.0000 mg | ORAL_TABLET | Freq: Four times a day (QID) | ORAL | Status: DC | PRN

## 2018-03-06 MED ORDER — HEPARIN BOLUS VIA INFUSION
2000.0000 [IU] | Freq: Once | INTRAVENOUS | Status: AC
  Administered 2018-03-06: 2000 [IU] via INTRAVENOUS
  Filled 2018-03-06: qty 2000

## 2018-03-06 MED ORDER — PANTOPRAZOLE SODIUM 40 MG PO TBEC
40.0000 mg | DELAYED_RELEASE_TABLET | Freq: Every day | ORAL | Status: DC
  Administered 2018-03-06 – 2018-03-07 (×2): 40 mg via ORAL
  Filled 2018-03-06 (×2): qty 1

## 2018-03-06 NOTE — H&P (Signed)
History and Physical    Marilyn Hanson:[id number] DOB: May 03, 1991 DOA: 03/06/2018  Referring MD/NP/PA: Dione Booze, EDP PCP: System, Pcp Not In  Patient coming from: Home  Chief Complaint: Chest pain, shortness of breath  HPI: Marilyn Hanson is a 27 y.o. 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  Below paragrapgh summarizes her hospital course at [facility] in June:  Marilyn Hanson is a 27 y.o. 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   Patient initially presented to [facility] in [place] with concern for RLE cellulitis. Workup there was remarkable for persistently positive blood cultures (group B strep). She was noted to have INR supratherapeutic at 6.1, inflammatory markers were elevated and NT proBNP was 170,000. There she was started on clindamycin and then transitioned to penicillin G and was transferred to our general medicine service for further management on 6/1.  On admission here she was found to have mitral valve endocarditis of her mechanical valve. She was transferred to the medical ICU 6/2 for an episode of acute hypotension. There was some suspicion of IV drug use however this was previously denied. She spent time in the ICU on pressor support and at that time the  outlook was grim as there was some concern for root abscess in the setting of PR prolongation and episodes of intermittent heart block with acute worsening after precedex on 6/5 that led to bradycardia to the 30s with documented sinus pauses. She had already been seen by the surgical team who felt that redo MVR would be certainly fatal hence there were no plans for intervention so there was no imaging performed at this time to confirm.    She presented to [facility] yesterday with complaints of chest pain or shortness of breath.  Per records that I have reviewed her troponin at that time was 0.07.  She later decided to leave AMA.  She represented to [facility] emergency department today with continued shortness of breath, chest pain and palpitations.  She was initially found to have a narrow complex tachycardia with initial heart rates in the 150s, was given adenosine in the ED with resolution.  Subsequent troponin here is 1.72.  We have been asked to admit her for further evaluation and management.  Past Medical/Surgical History: Past Medical History:  Diagnosis Date  . Anemia   . Anxiety   . Ascites   . Bacteremia due to group B Streptococcus 10/2017   [facility] admit  . CHF (congestive heart failure) (HCC)   . Chronic anticoagulation    coumadin  . Chronic edema    RLE>LLE  . Chronic pain syndrome   . Dysphagia   . Endocarditis 10/2017   [facility] admit  . ESRD on hemodialysis (HCC)   . Headache disorder   . Hemodialysis patient (HCC)   . High risk medication use    opiods, benzos  . Hypertension   . Hypertriglyceridemia   .  IgA nephropathy   . IgA nephropathy   . IVDU (intravenous drug user) 10/2017   suspected, per [facility] admission notes  . Obesity (BMI 30-39.9)   . On home O2    3L N/C  . Positive hepatitis C antibody test   . Tricuspid regurgitation     Past Surgical History:  Procedure Laterality Date  . MITRAL VALVE REPLACEMENT     mechanical    Social History:   reports that she has been smoking. She has been smoking about 0.50 packs per day. She has never used smokeless tobacco. She reports that she does not drink alcohol or use drugs.  Allergies: Allergies  Allergen Reactions  . Bee Venom Anaphylaxis    Note - tolerates medihoney w/no reaction  . Topiramate Hives, Shortness Of Breath and Swelling  . Azithromycin Itching and Rash  . Gentamicin Rash  . Nitroglycerin Other (See Comments)    SEVERE ALLERGY Unclear. Cant remember. " vomited black and nearly died/low blood pressure"   . Nsaids Other (See Comments)  . Vancomycin Itching, Rash and Swelling    Per pt report   . Asa [Aspirin] Itching  . Ondansetron Hcl Hives  . Ibuprofen Itching, Swelling and Rash  . Penicillins Itching, Swelling and Rash  . Sulfamethoxazole-Trimethoprim Rash    Family History:  Unable to obtain family history from patient as she is currently somnolent after receiving pain medication, sister at bedside tells me that there are no major medical issues in the family.  Prior to Admission medications   Medication Sig Start Date End Date Taking? Authorizing Provider  AURYXIA 1 GM 210 MG(Fe) tablet Take 2 tablets by mouth 3 (three) times daily with meals. 02/18/18   [provider]  clonazePAM (KLONOPIN) 0.5 MG tablet Take 0.5 mg by mouth 2 (two) times daily.     [provider]  diphenhydrAMINE (BENADRYL) 25 MG tablet Take 25-50 mg by mouth every 6 (six) hours as needed for itching.     [provider]  epoetin alfa (EPOGEN,PROCRIT) 2000 UNIT/ML injection Inject 2,000 Units into the skin Every Tuesday,Thursday,and Saturday with dialysis.     [provider]  HYDROmorphone (DILAUDID) 2 MG tablet Take 2 mg by mouth every 6 (six) hours as needed for severe pain.    [provider]  lactulose (CHRONULAC) 10 GM/15ML solution Take 60 mLs (40 g total) by mouth 3 (three) times daily. 03/02/18   Sherryll Burger, Pratik D, DO  metoprolol tartrate  (LOPRESSOR) 50 MG tablet Take 1 tablet (50 mg total) by mouth 2 (two) times daily. 02/13/17   Raeford Razor, MD  naloxone Ocr [facility]) 0.4 MG/ML injection Place 0.4 mg into the nose once as needed (for opiod overdose).  08/25/15   [provider]  ondansetron (ZOFRAN-ODT) 8 MG disintegrating tablet Take 8 mg by mouth every 8 (eight) hours as needed for nausea or vomiting.    [provider]  oxyCODONE (OXYCONTIN) 10 mg 12 hr tablet Take 10 mg by mouth every 12 (twelve) hours.    [provider]  OXYGEN Inhale 4 L into the lungs daily.    [provider]  pantoprazole (PROTONIX) 40 MG tablet Take 40 mg by mouth daily.    [provider]  sevelamer (RENAGEL) 800 MG tablet Take 800 mg by mouth 3 (three) times daily with meals.    [provider]  sevelamer carbonate (RENVELA) 0.8 g PACK packet Take 0.8 g by mouth daily. To take with largest meal of the  day 02/16/18   [provider]  traZODone (DESYREL) 100 MG tablet Take 50 mg by mouth at bedtime.     [provider]  warfarin (COUMADIN) 4 MG tablet Take 4-5 mg by mouth See admin instructions. Alternates taking 4mg  with 5mg  daily in the evening    [provider]    Review of Systems: Unable to obtain due to somnolence   Physical Exam: Vitals:   03/06/18 0752 03/06/18 0818 03/06/18 1007 03/06/18 1030  BP: 114/66 (!) 95/59  (!) 89/46  Pulse: 99 96 83 86  Resp: (!) 21 18  12   Temp:      TempSrc:      SpO2: 100% 100% 100% 100%  Weight:      Height:         Constitutional: Somnolent, opens eyes briefly to voice but is unable to answer questions, obese. Eyes: PERRL, lids and conjunctivae normal ENMT: Mucous membranes are moist. Posterior pharynx clear of any exudate or lesions.Normal dentition.  Neck: normal, supple, no masses, no thyromegaly Respiratory: clear to auscultation bilaterally, no wheezing, no crackles. Normal respiratory effort. No accessory muscle use.    Cardiovascular: Regular rate and rhythm, no murmurs / rubs / gallops.  Metallic click.  No extremity edema. 2+ pedal pulses. No carotid bruits.  Abdomen: no tenderness, no masses palpated. No hepatosplenomegaly. Bowel sounds positive.  Musculoskeletal: no clubbing / cyanosis. No joint deformity upper and lower extremities. Good ROM, no contractures. Normal muscle tone.  Skin: Numerous skin lesions over arms and legs. Neurologic: Unable to fully assess given current mental state Psychiatric: Unable to assess given current mental state   Labs on Admission: I have personally reviewed the following labs and imaging studies  CBC: Recent Labs  Lab 02/28/18 1521 03/01/18 0446 03/06/18 0625  WBC 4.2 4.7 6.0  NEUTROABS 2.5  --  3.4  HGB 11.4* 11.3* 11.5*  HCT 35.8* 35.2* 37.9  MCV 102.9* 105.1* 108.6*  PLT 120* 129* 133*   Basic Metabolic Panel: Recent Labs  Lab 02/28/18 1521 03/01/18 0446 03/06/18 0625  NA 133* 139 139  K 4.5 4.6 3.7  CL 95* 98 101  CO2 17* 21* 26  GLUCOSE 99 99 109*  BUN 76* 84* 43*  CREATININE 8.95* 10.00* 5.40*  CALCIUM 8.7* 8.9 9.4   GFR: Estimated Creatinine Clearance: 16.4 mL/min (A) (by C-G formula based on SCr of 5.4 mg/dL (H)). Liver Function Tests: Recent Labs  Lab 02/28/18 1521 03/01/18 0446 03/06/18 0625  AST 50* 46* 34  ALT 34 31 22  ALKPHOS 318* 290* 252*  BILITOT 4.3* 3.7* 3.4*  PROT 8.8* 8.3* 8.4*  ALBUMIN 3.7 3.3* 3.4*   Recent Labs  Lab 02/28/18 1521 03/06/18 0625  LIPASE 31 114*   Recent Labs  Lab 02/28/18 1750 03/01/18 0446  AMMONIA 90* 127*   Coagulation Profile: Recent Labs  Lab 02/28/18 1521 03/01/18 0851 03/06/18 0625  INR 3.92 2.67 1.80   Cardiac Enzymes: Recent Labs  Lab 02/28/18 1521 03/06/18 0625  TROPONINI 0.07* 1.72*   BNP (last 3 results) No results for input(s): PROBNP in the last 8760 hours. HbA1C: No results for input(s): HGBA1C in the last 72 hours. CBG: No results for input(s): GLUCAP  in the last 168 hours. Lipid Profile: No results for input(s): CHOL, HDL, LDLCALC, TRIG, CHOLHDL, LDLDIRECT in the last 72 hours. Thyroid Function Tests: No results for input(s): TSH, T4TOTAL, FREET4, T3FREE, THYROIDAB in the last 72 hours. Anemia Panel: No results for input(s): VITAMINB12,  FOLATE, FERRITIN, TIBC, IRON, RETICCTPCT in the last 72 hours. Urine analysis: No results found for: COLORURINE, APPEARANCEUR, LABSPEC, PHURINE, GLUCOSEU, HGBUR, BILIRUBINUR, KETONESUR, PROTEINUR, UROBILINOGEN, NITRITE, LEUKOCYTESUR Sepsis Labs: @LABRCNTIP (procalcitonin:4,lacticidven:4) ) Recent Results (from the past 240 hour(s))  MRSA PCR Screening     Status: None   Collection Time: 02/28/18  9:36 PM  Result Value Ref Range Status   MRSA by PCR NEGATIVE NEGATIVE Final    Comment:        The GeneXpert MRSA Assay (FDA approved for NASAL specimens only), is one component of a comprehensive MRSA colonization surveillance program. It is not intended to diagnose MRSA infection nor to guide or monitor treatment for MRSA infections. Performed at [facility], [address], [place], [place] [address]      Radiological Exams on Admission: Dg Chest Port 1 View  Result Date: 03/06/2018 CLINICAL DATA:  Chest pain. EXAM: PORTABLE CHEST 1 VIEW COMPARISON:  Radiographs 6 days prior 02/28/2018 FINDINGS: Post median sternotomy. Cardiomegaly is unchanged with prosthetic mitral valve. Low lung volumes with bronchovascular crowding and peribronchial thickening. Possible small pleural effusions. No pneumothorax. IMPRESSION: 1. Cardiomegaly. Low lung volumes with bronchovascular crowding. Peribronchial thickening may be bronchitic or congestive. 2. Possible small pleural effusions. Electronically Signed   By: Narda Rutherford M.D.   On: 03/06/2018 06:55      Assessment/Plan Principal Problem:   Chest pain Active Problems:   Elevated troponin   Hypertension   IgA nephropathy   ESRD on hemodialysis  (HCC)   Obesity (BMI 30-39.9)   Tricuspid regurgitation   Mechanical heart valve present    Chest pain/shortness of breath/elevated troponin -Certainly concerning in this lady with her complex cardiac history. -Will reorder 2D echo to assess mechanical valve given concern on prior hospitalization at [facility] for root abscess. -Will continue to cycle troponins. -Will start IV heparin, no aspirin given her allergy. -I have discussed case with Dr. Jacques Navy, cardiologist at [facility] who will see patient in consultation upon her arrival. -Due to her prior history of prosthetic valve endocarditis, blood cultures have been requested.  She is certainly not febrile at this time.  End-stage renal disease -Secondary to a IgA nephropathy, on hemodialysis on a TTS schedule. -Discussed with Dr. Detterding who will see patient for dialysis needs.  Acute encephalopathy -Per sister, she did not sleep at all the prior night and received a dose of Benadryl in the emergency department.  States this is usual of her when she has sleep deprivation. -No further work-up unless she remains somnolent.  Obesity -Noted   DVT prophylaxis: IV heparin Code Status: Full code Family Communication: Sister at bedside updated on plan of care and all questions answered Disposition Plan: Transfer to [facility] due to lack of cardiology coverage over the weekend. Consults called: Cardiology, nephrology Admission status: Admit - It is my clinical opinion that admission to INPATIENT is reasonable and necessary because of the expectation that this patient will require hospital care that crosses at least 2 midnights to treat this condition based on the medical complexity of the problems presented.  Given the aforementioned information, the predictability of an adverse outcome is felt to be significant.      Time Spent: 95 minutes  Avory Mimbs Philip Aspen MD [facility] Pager [phone]  If 7PM-7AM,  please contact night-coverage [email or web address] Password [email or web address]  03/06/2018, 11:08 AM

## 2018-03-06 NOTE — Progress Notes (Signed)
ANTICOAGULATION CONSULT NOTE - Follow Up Consult  Pharmacy Consult for heparin Indication: ACS/STEMI  Allergies  Allergen Reactions  . Bee Venom Anaphylaxis    Note - tolerates medihoney w/no reaction  . Topiramate Hives, Shortness Of Breath and Swelling  . Azithromycin Itching and Rash  . Gentamicin Rash  . Nitroglycerin Other (See Comments)    SEVERE ALLERGY Unclear. Cant remember. " vomited black and nearly died/low blood pressure"   . Nsaids Other (See Comments)  . Vancomycin Itching, Rash and Swelling    Per pt report   . Asa [Aspirin] Itching  . Ondansetron Hcl Hives  . Ibuprofen Itching, Swelling and Rash  . Penicillins Itching, Swelling and Rash  . Sulfamethoxazole-Trimethoprim Rash    Patient Measurements: Height: 5\' 5"  (165.1 cm) Weight: 181 lb 3.5 oz (82.2 kg) IBW/kg (Calculated) : 57 Heparin Dosing Weight: 75 kg  Vital Signs: Temp: 97.9 F (36.6 C) (10/18 0612) Temp Source: Oral (10/18 0612) BP: 89/46 (10/18 1030) Pulse Rate: 86 (10/18 1030)  Labs: Recent Labs    03/06/18 0625  HGB 11.5*  HCT 37.9  PLT 133*  LABPROT 20.6*  INR 1.80  CREATININE 5.40*  TROPONINI 1.72*    Estimated Creatinine Clearance: 16.4 mL/min (A) (by C-G formula based on SCr of 5.4 mg/dL (H)).   Medications:   (Not in a hospital admission)  Assessment: Pharmacy consulted to dose heparin in patient with ACS/STEMI.  Patient is on warfarin at home with INR on admission subtherapeutic at 1.80.  Goal of Therapy:  Heparin level 0.3-0.7 units/ml Monitor platelets by anticoagulation protocol: Yes   Plan:  Give 4000 units bolus x 1 Start heparin infusion at 900 units/hr Check anti-Xa level in 6 hours and daily while on heparin Continue to monitor H&H and platelets  Viviann Spare C Amiliana Foutz 03/06/2018,11:12 AM

## 2018-03-06 NOTE — ED Notes (Signed)
Date and time results received: 03/06/18 0653   Test: Troponin Critical Value: 1.72  Name of Provider Notified: Preston Fleeting, MD

## 2018-03-06 NOTE — Progress Notes (Signed)
Dr. Margo Aye text paged via Amion, Pt is nauseated, anxious and is c/o pain. Requesting for MD to see pt and evaluate. VSS.97.7 F; BP 104/67 ; HR 99 NSR, 100% on 4 LPM O2 per nasal cannula.

## 2018-03-06 NOTE — ED Notes (Signed)
[facility] called for report. 

## 2018-03-06 NOTE — Progress Notes (Signed)
Dr Margo Aye paged, called back and she has indicated she will call cardiology to follow-up consult. Rhonda barrett PA-C text paged about result. And had also followed up on her diet.

## 2018-03-06 NOTE — Consult Note (Addendum)
Cardiology Consultation:   Patient ID: TATIONNA FULLARD MRN: [id number]; DOB: Jun 26, 1990  Admit date: 03/06/2018 Date of Consult: 03/06/2018  Primary Care Provider: System, Pcp Not In Primary Cardiologist: [facility] Primary Electrophysiologist:  None    Patient Profile:   Marilyn Hanson is a 27 y.o. 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 Margo Aye.  History of Present Illness:   Marilyn Hanson has a complex medical history as above.  She presented to [facility] yesterday with complaints of chest pain and shortness of breath.  Reportedly her troponin at that time was 0.07.  She later decided to leave AMA.  She presented to [facility] emergency department today with continued shortness of breath, chest pain and palpitations.  She was initially found to have narrow complex tachycardia in the 150s and was given adenosine in the ED with resolution.  Subsequent troponins here was 1.72, and 2.39.   Pt was hospitalized at [facility] 10/18/17-11/11/17 with bacteremia due to group B streptococcus. The staff had difficulty keeping the patient on the nursing unit and she was eventually signed out AMA due to leaving the unit. She has originally presented to a hospital in [place], [place] with RLE edema and redness. SHe was negative for DVT. Blood cultures were positive. She was started on antibiotics with persistently positive blood cultures. She was transferred to [facility] for further management and infectious disease input.  She was found to have endocarditis and developed acute hypotension.  She is opioid dependent for chronic pain  and on Klonopin for anxiety.  Review of notes from [facility] indicate suspected IV drug abuse although she had persistently denied this.  She developed altered mental status in the hospital and had to be given Narcan with improved blood pressures and mental status. Treated at [facility] with completion of IV antibiotics per office note on 12/03/17.  She required phenylephrine briefly and then Precedex in the neuro intensive care.  She developed bradycardia in the 30s with pauses.  The cardiologist at [facility] felt that the bradycardia was unlikely and uncontrolled aortic root abscess and more likely a vagal event.  She developed atrial flutter with transient chest pain of unclear etiology.  EKG at the time of chest pain showed ST elevations in V2/V3.  [facility] heart physicians evaluated her with a TEE to evaluate for left atrial appendage clot and assess for aortic root abscess.  The TEE showed no abscess and improving vegetation.  It was thought that the patient may have had an embolic phenomena leading to chest pain/rise in troponin.  Cardiology did not recommend further evaluation.  Transthoracic echo on 10/19/2017 showed EF 55%, severe tricuspid regurg, new mobile target consistent with endocarditis noted on mechanical mitral valve, new significant RV dysfunction and enlargement.  TEE on 11/07/2017 showed normal LV function, moderate to severe RV dysfunction, normal functioning mechanical prosthetic mitral valve, no other aortic, tricuspid or pulmonic valvular vegetation or abscess.  She had prominent oscillating structure below the mitral prosthetic annulus in the left ventricle, may represent remaining mitral support apparatus, does not have typical appearance for vegetation, also seen on chest wall imaging.  Right heart cath was attempted to measure her  hepatic wedge pressure to determine whether or not to have her undergo CT surgery, however patient was to encephalopathic and the procedure was aborted.  CT surgery  subsequently stated that the patient was not a surgical candidate anyway.  She has a history of cardiac catheterization in 05/2013 at time of her valve replacement that showed normal coronary arteries.  Marilyn Hanson is a little hard to follow but she tells me that she has been having issues with her left upper arm fistula which got infiltrated and was supposed to be surgically intervened on today.  This is been causing her a lot of stress.  She feels like when she has a bowel movement and pushes her fistula feels up and has pressure.  Apparently after she has had dialysis she went home 2 kg below her dry weight and felt good.  She was decorating for Halloween and cooking and then playing games with her family.  After that she sat in a recliner to rest and developed a sudden fast heartbeat.  She then developed pain in her jaws, shoulders and back that felt like a burning sensation, like fire.  She was also short of breath.  She denies having had any substernal chest pain.  She says that she has a history of intermittent fast heartbeat and has as needed metoprolol 25 mg to take.  She took this but did not get better and her family called 911.  She says that EMS found her heart rate to be 148 bpm.  They gave her high flow oxygen and took her to the ED where she received adenosine with termination of tachycardia.  She said that she has had this treatment with adenosine many times in the past.  She currently denies any chest pain.   Past Medical History:  Diagnosis Date  . Anemia   . Anxiety   . Ascites   . Bacteremia due to group B Streptococcus 10/2017   [facility] admit  . CHF (congestive heart failure) (HCC)   . Chronic anticoagulation    coumadin  . Chronic edema    RLE>LLE  . Chronic pain syndrome   . Dysphagia   . Endocarditis 10/2017   [facility] admit  . ESRD on hemodialysis (HCC)   . Headache disorder   . Hemodialysis patient (HCC)   . High risk medication use    opiods, benzos  . Hypertension   .  Hypertriglyceridemia   . IgA nephropathy   . IgA nephropathy   . IVDU (intravenous drug user) 10/2017   suspected, per [facility] admission notes  . Obesity (BMI 30-39.9)   . On home O2    3L N/C  . Positive hepatitis C antibody test   . Tricuspid regurgitation     Past Surgical History:  Procedure Laterality Date  . MITRAL VALVE REPLACEMENT     mechanical     Home Medications:  Prior to Admission medications   Medication Sig Start Date End Date Taking? Authorizing Provider  AURYXIA 1 GM 210 MG(Fe) tablet Take 2 tablets by mouth 3 (three) times daily with meals. 02/18/18  Yes [provider]  clonazePAM (KLONOPIN) 0.5 MG tablet Take 0.5 mg by mouth 2 (two) times daily.    Yes [provider]  diphenhydrAMINE (BENADRYL) 25 MG tablet Take 25-50 mg by mouth every 6 (six) hours as needed for itching.    Yes [provider]  epoetin alfa (EPOGEN,PROCRIT) 2000 UNIT/ML injection Inject 2,000 Units into the skin Every Tuesday,Thursday,and Saturday with  dialysis.    Yes [provider]  HYDROmorphone (DILAUDID) 2 MG tablet Take 2 mg by mouth every 6 (six) hours as needed for severe pain.   Yes [provider]  metoprolol tartrate (LOPRESSOR) 50 MG tablet Take 1 tablet (50 mg total) by mouth 2 (two) times daily. 02/13/17  Yes Raeford Razor, MD  naloxone [facility]) 0.4 MG/ML injection Place 0.4 mg into the nose once as needed (for opiod overdose).  08/25/15  Yes [provider]  ondansetron (ZOFRAN-ODT) 8 MG disintegrating tablet Take 8 mg by mouth every 8 (eight) hours as needed for nausea or vomiting.   Yes [provider]  oxyCODONE (OXYCONTIN) 10 mg 12 hr tablet Take 10 mg by mouth every 12 (twelve) hours.   Yes [provider]  OXYGEN Inhale 4 L into the lungs daily.   Yes [provider]  pantoprazole (PROTONIX) 40 MG tablet Take 40 mg by mouth daily.   Yes [provider]  sevelamer (RENAGEL) 800 MG tablet Take  800 mg by mouth 3 (three) times daily with meals.   Yes [provider]  sevelamer carbonate (RENVELA) 0.8 g PACK packet Take 0.8 g by mouth daily. To take with largest meal of the day 02/16/18  Yes [provider]  traZODone (DESYREL) 100 MG tablet Take 50 mg by mouth at bedtime.    Yes [provider]  warfarin (COUMADIN) 4 MG tablet Take 4-5 mg by mouth See admin instructions. Alternates taking 4mg  with 5mg  daily in the evening   Yes [provider]    Inpatient Medications: Scheduled Meds: . [START ON 03/07/2018] Chlorhexidine Gluconate Cloth  6 each Topical Q0600  . clonazePAM  0.5 mg Oral BID  . HYDROmorphone      . metoprolol tartrate  50 mg Oral BID  . [START ON 03/07/2018] oxyCODONE  10 mg Oral Q12H  . pantoprazole  40 mg Oral Daily  . sevelamer carbonate  800 mg Oral TID WC  . sodium chloride flush  3 mL Intravenous Q12H   Continuous Infusions: . sodium chloride    . heparin 900 Units/hr (03/06/18 1216)   PRN Meds: sodium chloride, acetaminophen **OR** acetaminophen, HYDROmorphone (DILAUDID) injection, ondansetron (ZOFRAN) IV, senna-docusate, sodium chloride flush  Allergies:    Allergies  Allergen Reactions  . Bee Venom Anaphylaxis    Note - tolerates medihoney w/no reaction  . Topiramate Hives, Shortness Of Breath and Swelling  . Azithromycin Itching and Rash  . Gentamicin Rash  . Nitroglycerin Other (See Comments)    SEVERE ALLERGY Unclear. Cant remember. " vomited black and nearly died/low blood pressure"   . Nsaids Other (See Comments)  . Vancomycin Itching, Rash and Swelling    Per pt report   . Asa [Aspirin] Itching  . Ondansetron Hcl Hives  . Ibuprofen Itching, Swelling and Rash  . Penicillins Itching, Swelling and Rash  . Sulfamethoxazole-Trimethoprim Rash    Social History:   Social History   Socioeconomic History  . Marital status: Single    Spouse name: Not on file  . Number of children: Not on file  .  Years of education: Not on file  . Highest education level: Not on file  Occupational History  . Not on file  Social Needs  . Financial resource strain: Not on file  . Food insecurity:    Worry: Not on file    Inability: Not on file  . Transportation needs:    Medical: Not on file  Non-medical: Not on file  Tobacco Use  . Smoking status: Current Every Day Smoker    Packs/day: 0.50  . Smokeless tobacco: Never Used  Substance and Sexual Activity  . Alcohol use: No  . Drug use: No  . Sexual activity: Not on file  Lifestyle  . Physical activity:    Days per week: Not on file    Minutes per session: Not on file  . Stress: Not on file  Relationships  . Social connections:    Talks on phone: Not on file    Gets together: Not on file    Attends religious service: Not on file    Active member of club or organization: Not on file    Attends meetings of clubs or organizations: Not on file    Relationship status: Not on file  . Intimate partner violence:    Fear of current or ex partner: Not on file    Emotionally abused: Not on file    Physically abused: Not on file    Forced sexual activity: Not on file  Other Topics Concern  . Not on file  Social History Narrative  . Not on file    Family History:    Family History  Problem Relation Age of Onset  . Anxiety disorder Mother   . Hypertension Mother   . Healthy Sister   . Healthy Brother   . Melanoma Maternal Grandfather   . Healthy Paternal Grandmother        at age 79  . CAD Paternal Grandfather        in his 13's  . Healthy Sister   . Healthy Brother   . Healthy Brother      ROS:  Please see the history of present illness.   All other ROS reviewed and negative.     Physical Exam/Data:   Vitals:   03/06/18 1136 03/06/18 1200 03/06/18 1230 03/06/18 1347  BP: 106/72 105/74 115/80 104/67  Pulse: 88 87 89 (!) 102  Resp: 12 11 12    Temp:    97.7 F (36.5 C)  TempSrc:    Oral  SpO2: 100% 100% 100% 100%    Weight:    89.9 kg  Height:    5\' 6"  (1.676 m)   No intake or output data in the 24 hours ending 03/06/18 1807 Filed Weights   03/06/18 0615 03/06/18 1347  Weight: 82.2 kg 89.9 kg   Body mass index is 31.99 kg/m.  General: Chronically ill-appearing young female HEENT: normal Neck: no JVD Endocrine:  No thryomegaly Vascular: No carotid bruits; diminished lower extremity pulses Cardiac:  normal S1, S2; RRR; 2/6 systolic murmur at left upper sternal border Lungs:  clear to auscultation bilaterally, no wheezing, rhonchi or rales  Abd: soft, nontender Ext: Lower extremities firm with chronic venous changes Musculoskeletal:  No deformities, BUE and BLE strength normal and equal Skin: warm and dry  Neuro:  CNs 2-12 intact, no focal abnormalities noted Psych:  Normal affect   EKG:  The EKG was personally reviewed and demonstrates: 9 is tachycardia at 149 bpm, at 6 AM this morning Telemetry:  Telemetry was personally reviewed and demonstrates: Sinus rhythm in the 80s-90s  Relevant CV Studies:   TEE 6/21 1. Normal LV function, moderate to severe RV dysfunction 2. Normal functioning mechanical prosthetic mitral valve with physiologic washing jets. No pathologic MR/MS. No definite valvular vegetation or abscess. 3. No other aortic, tricuspid, or pulmonic valvular vegetation or abscess. 4. Severe TR, trivial  AI/PI 5. Prominent oscillating structure below mitral prosthetic annulus in the left ventricle, may represent remaining mitral support apparatus, does not have typical appearance for vegetation, also seen on chest wall imaging.  Echocardiogram 10/19/2017 at [facility] NORMAL LEFT VENTRICULAR SYSTOLIC FUNCTION WITH MILD LVH SEVERE RV SYSTOLIC DYSFUNCTION (See above) VALVULAR REGURGITATION: TRIVIAL AR, TRIVIAL PR, SEVERE TR PROSTHETIC VALVE(S): MECH PROSTHETIC MV -EXAM PARTIALLY LIMITED DUE TO PATIENT REQUEST / CONDITION. -PULMONARY ARTERY ACCELERATION TIME =  80ms (ABNORMAL). -ABNORMAL, THICKENED MECHANICAL MV PROSTHESIS WITH MOBILE TARGETS NOTED.  Compared with prior Echo study on 01/14/2015: NEW MOBILE TARGETS C/W ENDOCARDITIS NOTED ON MECHANICAL MV;NEW SIGNIFICANT RV DYSFUNCTION AND ENLARGEMENT   Heart Cath 05/25/2013 Diagnostic Findings Coronary arteries Dominance: right All coronaries normal (no CAD). Hemodynamics (mm Hg) State: Baseline Ao/BP (asc Ao): 139/96 Mean: 110 mmHg LV: 139/9 EDP: 20 mmHg Right Heart Catheterization State: Baseline RA: 9 mmHg (mean) RV: 70/ 5 mmHg PA: 74/ 28 52 mmHg (mean) PCW: 38 mmHg (mean) AV O2: 3.5 vol% Cardiac output: 7.3 L/min Cardiac index: 3.6 L/min-m2 PVR: 1.9 Wood units Shunt Ratio: 1.0 (Qp/Qs) L to R shunt: 0.0 L/min (Qp-Qep) R to L shunt: 0.0 L/min (Qs-Qep)   Laboratory Data:  Chemistry Recent Labs  Lab 02/28/18 1521 03/01/18 0446 03/06/18 0625  NA 133* 139 139  K 4.5 4.6 3.7  CL 95* 98 101  CO2 17* 21* 26  GLUCOSE 99 99 109*  BUN 76* 84* 43*  CREATININE 8.95* 10.00* 5.40*  CALCIUM 8.7* 8.9 9.4  GFRNONAA 5* 5* 10*  GFRAA 6* 5* 11*  ANIONGAP 21* 20* 12    Recent Labs  Lab 02/28/18 1521 03/01/18 0446 03/06/18 0625  PROT 8.8* 8.3* 8.4*  ALBUMIN 3.7 3.3* 3.4*  AST 50* 46* 34  ALT 34 31 22  ALKPHOS 318* 290* 252*  BILITOT 4.3* 3.7* 3.4*   Hematology Recent Labs  Lab 02/28/18 1521 03/01/18 0446 03/06/18 0625  WBC 4.2 4.7 6.0  RBC 3.48* 3.35* 3.49*  HGB 11.4* 11.3* 11.5*  HCT 35.8* 35.2* 37.9  MCV 102.9* 105.1* 108.6*  MCH 32.8 33.7 33.0  MCHC 31.8 32.1 30.3  RDW 16.2* 16.2* 16.3*  PLT 120* 129* 133*   Cardiac Enzymes Recent Labs  Lab 02/28/18 1521 03/06/18 0625 03/06/18 1418  TROPONINI 0.07* 1.72* 2.39*   No results for input(s): TROPIPOC in the last 168 hours.  BNPNo results for input(s): BNP, PROBNP in the last 168 hours.  DDimer No results for input(s): DDIMER in the last 168 hours.  Radiology/Studies:  Dg Chest Port 1  View  Result Date: 03/06/2018 CLINICAL DATA:  Chest pain. EXAM: PORTABLE CHEST 1 VIEW COMPARISON:  Radiographs 6 days prior 02/28/2018 FINDINGS: Post median sternotomy. Cardiomegaly is unchanged with prosthetic mitral valve. Low lung volumes with bronchovascular crowding and peribronchial thickening. Possible small pleural effusions. No pneumothorax. IMPRESSION: 1. Cardiomegaly. Low lung volumes with bronchovascular crowding. Peribronchial thickening may be bronchitic or congestive. 2. Possible small pleural effusions. Electronically Signed   By: Narda Rutherford M.D.   On: 03/06/2018 06:55    Assessment and Plan:   NSTEMI -Patient presented with tachycardia and associated jaw, shoulder and back burning pain and shortness of breath.  Noted to be in narrow complex tachycardia in the 150s in the ED, terminated with adenosine.  No discomfort when not tachycardic.  No current chest, jaw or shoulder pain. -History of heart cath in 05/2013 at [facility] showed normal coronary arteries -Troponins have been trending up from 0.07 > 1.72> 2.39.  Possibly  related to demand ischemia in the setting of tachyarrhythmia -Plan for echocardiogram.  If normalities seen, she may need TEE to assess gradients. -Unlikely to be ACS with her prior normal coronary arteries by cath in 2015 -Continue to trend troponin until they peak.  May consider cardiac cath next week if concerning symptoms over the weekend.  Intermittent tachycardia -Concerning for SVT, terminated with adenosine. -She has had a history of atrial flutter in the past treated at [facility] with DCCV and amiodarone at one point.  She has had bradycardia in the past on beta-blockers.  She also has a history of repeated episodes of tachycardia that terminate with adenosine in the ED.  She has as needed metoprolol which did not convert her tachycardia on this occasion. -Continue metoprolol for now and watch for recurrent SVT or bradycardia. -May want to consider EP consult  for further evaluation  S/P mitral valve replacement 05/2013 at [facility] -Valve click is not very crisp.  Will assess her valves on echocardiogram -History of GBS bacteremia in June and has completed her outpatient antibiotic therapy -Agree with checking blood cultures -On chronic warfarin.  INR this morning was subtherapeutic.  She has been started on heparin drip.  ESRD on hemodialysis secondary to IgA nephropathy -Being followed by nephrology with hemodialysis on Tuesday Thursday Saturday schedule -Has chronic hypotension with dialysis and is on admitted drain 10 mg 3 times daily.  History of calciphylaxis   For questions or updates, please contact [facility] Please consult [email or web address] for contact info under    Signed, Berton Bon, NP  03/06/2018 6:07 PM ---------------------------------------------------------------------------------------------   History and all data above reviewed.  Patient examined.  I agree with the findings as above.  Marilyn Hanson is a 27 year old female with a complex past medical history including IgA nephropathy with end-stage renal disease on dialysis (Tuesday Thursday Saturday), calciphylaxis, chronic pain syndrome, and possible IV drug use with endocarditis and subsequent mitral valve replacement with mechanical prosthesis on warfarin with subtherapeutic INR, diastolic congestive heart failure, severe tricuspid regurgitation with congestive hepatopathy and hepatomegaly, chronic stasis changes of the lower extremities with lower extremity edema, chronic respiratory failure on home oxygen.  We have been asked to see the patient for chest pain and elevated troponin by Dr. Margo Aye.  Marilyn Hanson tells me that she is quite anxious about what will happen to her in the hospital.  She is having pain related to her left upper extremity fistula.  She has been experiencing chest discomfort in conjunction with palpitations and has been noted to have a supraventricular  tachycardia responsive to adenosine. She is transferred to [facility] today for management of chest pain, and elevated troponin, and concern for acute coronary syndrome.  Constitutional: anxious, sitting on side of bed Eyes: pupils equally round and reactive to light,  ENMT: moist mucous membranes Cardiovascular: regular rhythm, normal rate, soft systolic murmur. Inappropriately soft valve closure sound at S1.  No jugular venous distention.  Respiratory: shallow inspiration GI : normal bowel sounds, soft and nontender. No distention.   MSK: extremities warm. Significant venous stasis changes with bilateral edema. NEURO: grossly nonfocal exam, moves all extremities. PSYCH: alert and oriented x 3, anxious and tearful.  All available labs, radiology testing, previous records reviewed. Agree with documented assessment and plan of my colleague as stated above with the following additions or changes:  Principal Problem:   Chest pain Active Problems:   Hypertension   IgA nephropathy   ESRD on hemodialysis (HCC)  Obesity (BMI 30-39.9)   Tricuspid regurgitation   Mechanical heart valve present   Elevated troponin   Supraventricular tachycardia (HCC)    Plan: With regard to the patient's chest pain, fortunately she had  an angiogram in 2015 with normal coronary arteries.  I anticipate that her troponin elevation is a combination of demand ischemia in the setting of a likely SVT, in combination with renal failure leading to an exaggerated elevation in the troponin.  We will watch her closely overnight to ensure that she is not truly having ACS.  I am also quite concerned about the inappropriately soft closure of her mitral valve sound on exam, and the fact that her INR is subtherapeutic (her mechanical mitral valve INR goal should be approximately 3, and she is not on aspirin due to an allergy).  It will be critical to obtain a transthoracic echocardiogram tomorrow to evaluate the  transmitral gradient and motion of the mitral valve leaflets.  If there is abnormal motion or a elevated gradient, a transesophageal echocardiogram should be performed on Monday to assess for valve thrombosis or infection.  Blood cultures have been drawn given a history of endocarditis, those are pending at this time.  With regard to the likely SVT which was responsive to adenosine, rate control with metoprolol may be the most reasonable strategy at this time until more information is gathered.  If necessary we will have our colleagues in electrophysiology and evaluate the patient for other treatment options.  Cardiology will follow along, anticipate echocardiogram tomorrow.  Parke Poisson, MD [facility] 7:28 PM  03/06/2018

## 2018-03-06 NOTE — ED Notes (Signed)
Pt states she is having a lot of anxiety. Is talking about wanting to walk out. Have advised pt this is not a smart decision.

## 2018-03-06 NOTE — Progress Notes (Signed)
ANTICOAGULATION CONSULT NOTE - Follow Up Consult  Pharmacy Consult for heparin Indication: CP and MVR   Labs: Recent Labs    03/06/18 0625 03/06/18 1418 03/06/18 1908  HGB 11.5*  --   --   HCT 37.9  --   --   PLT 133*  --   --   LABPROT 20.6*  --   --   INR 1.80  --   --   HEPARINUNFRC  --   --  <0.10*  CREATININE 5.40*  --   --   TROPONINI 1.72* 2.39* 2.01*    Assessment/Plan:  RN reports that old wounds on pt's buttocks have begun bleeding, initially controlled but have again started bleeding.  Pt rec'd heparin bolus ~3hr ago; will hold heparin gtt x42min then resume at current rate.  Vernard Gambles, PharmD, BCPS  03/06/2018,11:49 PM

## 2018-03-06 NOTE — Consult Note (Signed)
Renal Service Consult Note [facility]  Marilyn Hanson 03/06/2018 Maree Krabbe Requesting Physician:  Dr Margo Aye, C.   Reason for Consult:  ESRD pt w/ chest pain, nausea , vomiting HPI: The patient is a 27 y.o. year-old presented w/ CP to [facility]. She had SVT there and got adenosine IV then was transferred for admission at [facility].  She was supposed to have L arm access surgery today.  She has a lesion on her left breast which has been there about 1 month.  She has a mechanical mitral valve, and back in June she had heart valve infection treated at [facility].    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  Gets HD in [place] on TTS schedule.    She was here in early Oct 2019 w/ altered mental status.        ROS  denies CP  no joint pain   no HA  no blurry vision  no rash  no diarrhea  no nausea/ vomiting   Past Medical History  Past Medical History:  Diagnosis Date  . Anemia   . Anxiety   . Ascites   . Bacteremia due to group B Streptococcus 10/2017   [facility] admit  . CHF (congestive heart failure) (HCC)   . Chronic anticoagulation    coumadin  . Chronic edema    RLE>LLE  . Chronic pain syndrome   . Dysphagia   . Endocarditis 10/2017   [facility] admit  . ESRD on hemodialysis (HCC)   . Headache disorder   . Hemodialysis patient (HCC)   . High risk medication use    opiods, benzos  . Hypertension   . Hypertriglyceridemia   . IgA nephropathy   . IgA nephropathy   . IVDU (intravenous drug user) 10/2017   suspected, per [facility] admission notes  . Obesity (BMI 30-39.9)   . On home O2    3L N/C  . Positive hepatitis C antibody test   . Tricuspid regurgitation    Past Surgical History  Past Surgical History:  Procedure Laterality Date  . MITRAL VALVE REPLACEMENT     mechanical   Family History No family history on file. Social History  reports that she has been smoking. She has been smoking about 0.50 packs per day. She has never used  smokeless tobacco. She reports that she does not drink alcohol or use drugs. Allergies  Allergies  Allergen Reactions  . Bee Venom Anaphylaxis    Note - tolerates medihoney w/no reaction  . Topiramate Hives, Shortness Of Breath and Swelling  . Azithromycin Itching and Rash  . Gentamicin Rash  . Nitroglycerin Other (See Comments)    SEVERE ALLERGY Unclear. Cant remember. " vomited black and nearly died/low blood pressure"   . Nsaids Other (See Comments)  . Vancomycin Itching, Rash and Swelling    Per pt report   . Asa [Aspirin] Itching  . Ondansetron Hcl Hives  . Ibuprofen Itching, Swelling and Rash  . Penicillins Itching, Swelling and Rash  . Sulfamethoxazole-Trimethoprim Rash   Home medications Prior to Admission medications   Medication Sig Start Date End Date Taking? Authorizing Provider  AURYXIA 1 GM 210 MG(Fe) tablet Take 2 tablets by mouth 3 (three) times daily with meals. 02/18/18  Yes [provider]  clonazePAM (KLONOPIN) 0.5 MG tablet Take 0.5 mg by mouth 2 (two) times daily.    Yes [provider]  diphenhydrAMINE (BENADRYL) 25 MG tablet Take 25-50 mg by  mouth every 6 (six) hours as needed for itching.    Yes [provider]  epoetin alfa (EPOGEN,PROCRIT) 2000 UNIT/ML injection Inject 2,000 Units into the skin Every Tuesday,Thursday,and Saturday with dialysis.    Yes [provider]  HYDROmorphone (DILAUDID) 2 MG tablet Take 2 mg by mouth every 6 (six) hours as needed for severe pain.   Yes [provider]  metoprolol tartrate (LOPRESSOR) 50 MG tablet Take 1 tablet (50 mg total) by mouth 2 (two) times daily. 02/13/17  Yes Raeford Razor, MD  naloxone [facility]) 0.4 MG/ML injection Place 0.4 mg into the nose once as needed (for opiod overdose).  08/25/15  Yes [provider]  ondansetron (ZOFRAN-ODT) 8 MG disintegrating tablet Take 8 mg by mouth every 8 (eight) hours as needed for nausea or vomiting.   Yes [provider]  oxyCODONE (OXYCONTIN) 10 mg 12 hr tablet Take 10 mg by mouth every 12 (twelve) hours.   Yes [provider]  OXYGEN Inhale 4 L into the lungs daily.   Yes [provider]  pantoprazole (PROTONIX) 40 MG tablet Take 40 mg by mouth daily.   Yes [provider]  sevelamer (RENAGEL) 800 MG tablet Take 800 mg by mouth 3 (three) times daily with meals.   Yes [provider]  sevelamer carbonate (RENVELA) 0.8 g PACK packet Take 0.8 g by mouth daily. To take with largest meal of the day 02/16/18  Yes [provider]  traZODone (DESYREL) 100 MG tablet Take 50 mg by mouth at bedtime.    Yes [provider]  warfarin (COUMADIN) 4 MG tablet Take 4-5 mg by mouth See admin instructions. Alternates taking 4mg  with 5mg  daily in the evening   Yes [provider]   Liver Function Tests Recent Labs  Lab 02/28/18 1521 03/01/18 0446 03/06/18 0625  AST 50* 46* 34  ALT 34 31 22  ALKPHOS 318* 290* 252*  BILITOT 4.3* 3.7* 3.4*  PROT 8.8* 8.3* 8.4*  ALBUMIN 3.7 3.3* 3.4*   Recent Labs  Lab 02/28/18 1521 03/06/18 0625  LIPASE 31 114*   CBC Recent Labs  Lab 02/28/18 1521 03/01/18 0446 03/06/18 0625  WBC 4.2 4.7 6.0  NEUTROABS 2.5  --  3.4  HGB 11.4* 11.3* 11.5*  HCT 35.8* 35.2* 37.9  MCV 102.9* 105.1* 108.6*  PLT 120* 129* 133*   Basic Metabolic Panel Recent Labs  Lab 02/28/18 1521 03/01/18 0446 03/06/18 0625  NA 133* 139 139  K 4.5 4.6 3.7  CL 95* 98 101  CO2 17* 21* 26  GLUCOSE 99 99 109*  BUN 76* 84* 43*  CREATININE 8.95* 10.00* 5.40*  CALCIUM 8.7* 8.9 9.4   Iron/TIBC/Ferritin/ %Sat No results found for: IRON, TIBC, FERRITIN, IRONPCTSAT  Vitals:   03/06/18 1136 03/06/18 1200 03/06/18 1230 03/06/18 1347  BP: 106/72 105/74 115/80 104/67  Pulse: 88 87 89 (!) 102  Resp: 12 11 12    Temp:    97.7 F (36.5 C)  TempSrc:    Oral  SpO2: 100% 100% 100% 100%  Weight:    89.9 kg  Height:    5\' 6"  (1.676 m)    Exam Gen anxious, no distress, nasal O2 No rash, cyanosis or gangrene Sclera anicteric, throat clear  No jvd or bruits Chest clear bilat no rales or wheezing L breast lesion w/ eschar and severely tender, 1x 3 cm RRR w/ PV click no RG Abd soft ntnd no mass or ascites +bs  GU defer  MS no joint effusions or deformity Ext 2+ chronic tense LE edema Neuro is alert, Ox 3 , nf LUA AVF+bruit    Dialysis:  TTS [facility]  4h 83kg  2/2 bath  P4  LUA AVF  Heparin none  - mircera 75 q 4    Impression/ Plan: 1. Chest pain - w/u in progress, trop 1.7 2. SVT - sp adenosine in ED overnight 3. ESRD on HD TTS.  HD tomorrow 4. 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 Anemia ckd - Hb 11.5, observe     Vinson Moselle MD [facility] pager [phone]   03/06/2018, 3:38 PM

## 2018-03-06 NOTE — ED Notes (Signed)
Pt is now sleeping soundly and HR is stable in the 90s

## 2018-03-06 NOTE — ED Provider Notes (Signed)
[facility] EMERGENCY DEPARTMENT Provider Note   CSN: [id number] Arrival date & time: 03/06/18  0601     History   Chief Complaint Chief Complaint  Patient presents with  . Chest Pain    HPI Marilyn Hanson is a 27 y.o. female.  The history is provided by the patient and a relative.  She has history of hypertension, hyperlipidemia, end-stage renal disease from IgA nephropathy and on dialysis, mitral valve replacement, chronically anticoagulated on warfarin, heart failure and comes in with right-sided chest pain which started about 9:30 PM.  She describes a burning pain which is worse with deep breath.  There is associated dyspnea, nausea, vomiting.  There has been no cough.  She denies any sweats.  She did have her usual dialysis session yesterday.  She had gone to the emergency department at [facility] earlier tonight and was told that she had an elevated troponin, but left AGAINST MEDICAL ADVICE.  Of note, she is also a chronic pain patient.  Past Medical History:  Diagnosis Date  . Anemia   . Anxiety   . Ascites   . Bacteremia due to group B Streptococcus 10/2017   [facility] admit  . CHF (congestive heart failure) (HCC)   . Chronic anticoagulation    coumadin  . Chronic edema    RLE>LLE  . Chronic pain syndrome   . Dysphagia   . Endocarditis 10/2017   [facility] admit  . ESRD on hemodialysis (HCC)   . Headache disorder   . Hemodialysis patient (HCC)   . High risk medication use    opiods, benzos  . Hypertension   . Hypertriglyceridemia   . IgA nephropathy   . IgA nephropathy   . IVDU (intravenous drug user) 10/2017   suspected, per [facility] admission notes  . Obesity (BMI 30-39.9)   . On home O2    3L N/C  . Positive hepatitis C antibody test   . Tricuspid regurgitation     Patient Active Problem List   Diagnosis Date Noted  . Ischemic stroke (HCC) 02/28/2018  . Acute encephalopathy 02/28/2018  . 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  Metabolic acidosis 02/28/2018  . Hypertension   . CHF (congestive heart failure) (HCC)   . IgA nephropathy   . ESRD on hemodialysis (HCC)   . Positive hepatitis C antibody test   . Obesity (BMI 30-39.9)   . Tricuspid regurgitation   . Chronic anticoagulation     Past Surgical History:  Procedure Laterality Date  . MITRAL VALVE REPLACEMENT     mechanical     OB History   None      Home Medications    Prior to Admission medications   Medication Sig Start Date End Date Taking? Authorizing Provider  AURYXIA 1 GM 210 MG(Fe) tablet Take 2 tablets by mouth 3 (three) times daily with meals. 02/18/18   [provider]  clonazePAM (KLONOPIN) 0.5 MG tablet Take 0.5 mg by mouth 2 (two) times daily.     [provider]  diphenhydrAMINE (BENADRYL) 25 MG tablet Take 25-50 mg by mouth every 6 (six) hours as needed for itching.     [provider]  epoetin alfa (EPOGEN,PROCRIT) 2000 UNIT/ML injection Inject 2,000 Units into the skin Every Tuesday,Thursday,and Saturday with dialysis.     [provider]  HYDROmorphone (DILAUDID) 2 MG tablet Take 2 mg by mouth every 6 (six) hours as needed for severe pain.    [provider]  lactulose (CHRONULAC) 10 GM/15ML solution Take 60 mLs (40 g total) by mouth 3 (three) times daily. 03/02/18   Sherryll Burger, Pratik D, DO  metoprolol tartrate (LOPRESSOR) 50 MG tablet Take 1 tablet (50 mg total) by mouth 2 (two) times daily. 02/13/17   Raeford Razor, MD  naloxone [facility]) 0.4 MG/ML injection Place 0.4 mg into the nose once as needed (for opiod overdose).  08/25/15   [provider]  ondansetron (ZOFRAN-ODT) 8 MG disintegrating tablet Take 8 mg by mouth every 8 (eight) hours as needed for nausea or vomiting.    [provider]  oxyCODONE (OXYCONTIN) 10 mg 12 hr tablet Take 10 mg by mouth every 12 (twelve) hours.    [provider]  OXYGEN Inhale 4 L into the lungs daily.    [provider]    pantoprazole (PROTONIX) 40 MG tablet Take 40 mg by mouth daily.    [provider]  sevelamer (RENAGEL) 800 MG tablet Take 800 mg by mouth 3 (three) times daily with meals.    [provider]  sevelamer carbonate (RENVELA) 0.8 g PACK packet Take 0.8 g by mouth daily. To take with largest meal of the day 02/16/18   [provider]  traZODone (DESYREL) 100 MG tablet Take 50 mg by mouth at bedtime.     [provider]  warfarin (COUMADIN) 4 MG tablet Take 4-5 mg by mouth See admin instructions. Alternates taking 4mg  with 5mg  daily in the evening    [provider]    Family History No family history on file.  Social History Social History   Tobacco Use  . Smoking status: Current Every Day Smoker    Packs/day: 0.50  . Smokeless tobacco: Never Used  Substance Use Topics  . Alcohol use: No  . Drug use: No     Allergies   Bee venom; Topiramate; Azithromycin; Gentamicin; Nitroglycerin; Nsaids; Vancomycin; Asa [aspirin]; Ondansetron hcl; Ibuprofen; Penicillins; and Sulfamethoxazole-trimethoprim   Review of Systems Review of Systems  All other systems reviewed and are negative.    Physical Exam Updated Vital Signs BP 109/74 (BP Location: Right Arm)   Pulse (!) 149   Temp 97.9 F (36.6 C) (Oral)   Resp (!) 25   Ht 5\' 5"  (1.651 m)   Wt 82.2 kg   SpO2 100%   BMI 30.16 kg/m   Physical Exam  Nursing note and vitals reviewed.  27 year old female, mildly agitated, but in no acute distress. Vital signs are significant for tachycardia and tachypnea. Oxygen saturation is 100%, which is normal. Head is normocephalic and atraumatic. PERRLA, EOMI. Oropharynx is clear. Neck is nontender and supple without adenopathy or JVD. Back is nontender and there is no CVA tenderness. Lungs are clear without rales, wheezes, or rhonchi. Chest is nontender. Heart has regular rate and rhythm without murmur. Abdomen is soft, nontender with moderate  ascites present, but without masses or hepatosplenomegaly and peristalsis is normoactive. Extremities have2+ pitting edema.  There is acrocyanosis involving the feet.  Capillary refill is delayed to 4 seconds.  Range of motion is present. Skin is warm and dry without rash. Neurologic: Intermittently mildly agitated but able to be directed and when directed she is oriented to person and place and time, cranial nerves are intact, there are no motor or sensory deficits. AV fistula is present in the left arm with thrill present. ED Treatments / Results  Labs (all labs ordered are listed, but only abnormal results are displayed) Labs Reviewed  COMPREHENSIVE METABOLIC PANEL - Abnormal; Notable for the following components:      Result Value   Glucose, Bld 109 (*)    BUN 43 (*)    Creatinine, Ser 5.40 (*)    Total Protein 8.4 (*)    Albumin 3.4 (*)    Alkaline Phosphatase 252 (*)    Total Bilirubin 3.4 (*)    GFR calc non Af Amer 10 (*)    GFR calc Af Amer 11 (*)    All other components within normal limits  LIPASE, BLOOD - Abnormal; Notable for the following components:   Lipase 114 (*)    All other components within normal limits  TROPONIN I - Abnormal; Notable for the following components:   Troponin I 1.72 (*)    All other components within normal limits  CBC WITH DIFFERENTIAL/PLATELET - Abnormal; Notable for the following components:   RBC 3.49 (*)    Hemoglobin 11.5 (*)    MCV 108.6 (*)    RDW 16.3 (*)    Platelets 133 (*)    All other components within normal limits  PROTIME-INR - Abnormal; Notable for the following components:   Prothrombin Time 20.6 (*)    All other components within normal limits  I-STAT CG4 LACTIC ACID, ED  I-STAT CG4 LACTIC ACID, ED    EKG EKG Interpretation  Date/Time:  Friday March 06 2018 06:13:27 EDT Ventricular Rate:  149 PR Interval:    QRS Duration: 124 QT Interval:  319 QTC Calculation: 503 R Axis:   169 Text Interpretation:  Sinus  tachycardia Nonspecific intraventricular conduction delay Borderline repol abnormality, diffuse leads Baseline wander in lead(s) V5 When compared with ECG of 02/28/2018, HEART RATE has increased Confirmed by Dione Booze ([id number]) on 03/06/2018 6:18:15 AM  Repeat ECG done at 6:58 AM Sinus rhythm 89 bpm.  Right axis deviation.  Prolonged PR interval.  Nonspecific intraventricular conduction delay.  When compared with ECG from earlier today, heart rate has decreased, repolarization abnormality has improved.   Radiology Dg Chest Port 1 View  Result Date: 03/06/2018 CLINICAL DATA:  Chest pain. EXAM: PORTABLE CHEST 1 VIEW COMPARISON:  Radiographs 6 days prior 02/28/2018 FINDINGS: Post median sternotomy. Cardiomegaly is unchanged with prosthetic mitral valve. Low lung volumes with bronchovascular crowding and peribronchial thickening. Possible small pleural effusions. No pneumothorax. IMPRESSION: 1. Cardiomegaly. Low lung volumes with bronchovascular crowding. Peribronchial thickening may be bronchitic or congestive. 2. Possible small pleural effusions. Electronically Signed   By: Narda Rutherford M.D.   On: 03/06/2018 06:55    Procedures .Cardioversion Date/Time: 03/06/2018 6:50 AM Performed by: Dione Booze, MD Authorized by: Dione Booze, MD   Consent:    Consent obtained:  Verbal   Consent given by:  Patient   Risks discussed:  Induced arrhythmia and death   Alternatives discussed:  Rate-control medication Pre-procedure details:    Cardioversion basis:  Emergent   Rhythm:  Supraventricular tachycardia   Electrode placement:  Anterior-posterior Patient sedated: No Post-procedure details:    Patient status:  Awake   Patient tolerance of procedure:  Tolerated well, no immediate complications Comments:     Chemical conversion was done with adenosine.  First attempt was with adenosine 6 mg with no change in rhythm.  Second attempt was with adenosine 12 mg with successful conversion to sinus  rhythm with heart rate 90.      CRITICAL CARE Performed by: Dione Booze Total critical care time: 100 minutes Critical care time was exclusive of separately billable procedures and  treating other patients. Critical care was necessary to treat or prevent imminent or life-threatening deterioration. Critical care was time spent personally by me on the following activities: development of treatment plan with patient and/or surrogate as well as nursing, discussions with consultants, evaluation of patient's response to treatment, examination of patient, obtaining history from patient or surrogate, ordering and performing treatments and interventions, ordering and review of laboratory studies, ordering and review of radiographic studies, pulse oximetry and re-evaluation of patient's condition.  Medications Ordered in ED Medications  adenosine (ADENOCARD) 6 MG/2ML injection 6 mg (12 mg Intravenous Given 03/06/18 0650)  adenosine (ADENOCARD) 6 MG/2ML injection (6 mg Intravenous Given 03/06/18 0645)     Initial Impression / Assessment and Plan / ED Course  I have reviewed the triage vital signs and the nursing notes.  Pertinent labs & imaging results that were available during my care of the patient were reviewed by me and considered in my medical decision making (see chart for details).  Chest pain with report of positive troponin.  Since patient is anticoagulated, doubt pulmonary embolism.  Will check chest x-ray to rule out pneumonia and repeat troponin level.  Will attempt to obtain records from [facility].  ECG showing tachycardia that I suspect may be atrial flutter.  Review of old records here shows a prior ED visit with tachycardic rhythm, but ECG at that time looked different from current ECG.  We will try adenosine to see if it will convert her to a slower rhythm, or show a temporary block to demonstrate flutter waves.   There was no response to 6 mg of adenosine.  With 12 mg of  adenosine, she had her heart rate slowed to approximately 100.  No flutter waves seen.  This seems to have been conversion from PSVT to sinus rhythm.  Unfortunately, patient continues to have complaints of chest pain and dyspnea in spite of the rhythm change.  However, her skin color has improved and cyanosis is no longer present.  Troponin is come back significantly elevated at 1.7, which is a significant increase from 6 days ago when it was 0.07.  Some of this may be demand ischemia from her tachyarrhythmia.  However, she will need to be admitted for serial troponins and careful monitoring.  She continues to be very anxious and is given a dose of diphenhydramine.  Case is discussed with Dr. Philip Aspen of [facility], who agrees to admit the patient.  She is anticoagulated on warfarin, so heparin is not started.  She is allergic to aspirin, so aspirin is not given.  Final Clinical Impressions(s) / ED Diagnoses   Final diagnoses:  None    ED Discharge Orders    None       Dione Booze, MD 03/06/18 [phone]

## 2018-03-06 NOTE — CV Procedure (Signed)
Attempted Echo, patient is extremely nauseated, vomiting, physically in pain.  Requested echo for tomorrow.  Spoke to Nurse Buendia, requested Nausea meds and advised patient is requesting pain meds, IV as she will vomit them up.

## 2018-03-06 NOTE — ED Notes (Signed)
Pt is ambulatory to bedside commode.

## 2018-03-06 NOTE — ED Triage Notes (Signed)
Patient left [facility] and had a complete work up. Patient complaining of chest pain at this time. Patient also complaining of pain all over. Patient has a history of kidney problems. Patient states pain is in her chest radiating into her jaw. Patient had an elevated troponin prior at [facility].

## 2018-03-06 NOTE — ED Notes (Signed)
[facility] left with [facility] 

## 2018-03-06 NOTE — Progress Notes (Signed)
ANTICOAGULATION CONSULT NOTE - Follow Up Consult  Pharmacy Consult for heparin Indication: ACS/STEMI and mechanical AVR  Allergies  Allergen Reactions  . Bee Venom Anaphylaxis    Note - tolerates medihoney w/no reaction  . Topiramate Hives, Shortness Of Breath and Swelling  . Azithromycin Itching and Rash  . Gentamicin Rash  . Nitroglycerin Other (See Comments)    SEVERE ALLERGY Unclear. Cant remember. " vomited black and nearly died/low blood pressure"   . Nsaids Other (See Comments)  . Vancomycin Itching, Rash and Swelling    Per pt report   . Asa [Aspirin] Itching  . Ondansetron Hcl Hives  . Ibuprofen Itching, Swelling and Rash  . Penicillins Itching, Swelling and Rash  . Sulfamethoxazole-Trimethoprim Rash    Patient Measurements: Height: 5\' 6"  (167.6 cm) Weight: 198 lb 3.2 oz (89.9 kg) IBW/kg (Calculated) : 59.3 Heparin Dosing Weight: 75 kg  Vital Signs: Temp: 97.7 F (36.5 C) (10/18 1347) Temp Source: Oral (10/18 1347) BP: 104/67 (10/18 1347) Pulse Rate: 102 (10/18 1347)  Labs: Recent Labs    03/06/18 0625 03/06/18 1418 03/06/18 1908  HGB 11.5*  --   --   HCT 37.9  --   --   PLT 133*  --   --   LABPROT 20.6*  --   --   INR 1.80  --   --   HEPARINUNFRC  --   --  <0.10*  CREATININE 5.40*  --   --   TROPONINI 1.72* 2.39*  --     Estimated Creatinine Clearance: 17.5 mL/min (A) (by C-G formula based on SCr of 5.4 mg/dL (H)).   Medications:  Medications Prior to Admission  Medication Sig Dispense Refill Last Dose  . AURYXIA 1 GM 210 MG(Fe) tablet Take 2 tablets by mouth 3 (three) times daily with meals.  11 unknown  . clonazePAM (KLONOPIN) 0.5 MG tablet Take 0.5 mg by mouth 2 (two) times daily.    03/05/2018 at Unknown time  . diphenhydrAMINE (BENADRYL) 25 MG tablet Take 25-50 mg by mouth every 6 (six) hours as needed for itching.    03/05/2018 at Unknown time  . epoetin alfa (EPOGEN,PROCRIT) 2000 UNIT/ML injection Inject 2,000 Units into the skin Every  Tuesday,Thursday,and Saturday with dialysis.    Past Week at Unknown time  . HYDROmorphone (DILAUDID) 2 MG tablet Take 2 mg by mouth every 6 (six) hours as needed for severe pain.   03/05/2018 at Unknown time  . metoprolol tartrate (LOPRESSOR) 50 MG tablet Take 1 tablet (50 mg total) by mouth 2 (two) times daily. 60 tablet 0 03/05/2018 at 0800  . naloxone [facility]) 0.4 MG/ML injection Place 0.4 mg into the nose once as needed (for opiod overdose).    unknown  . ondansetron (ZOFRAN-ODT) 8 MG disintegrating tablet Take 8 mg by mouth every 8 (eight) hours as needed for nausea or vomiting.   03/05/2018 at Unknown time  . oxyCODONE (OXYCONTIN) 10 mg 12 hr tablet Take 10 mg by mouth every 12 (twelve) hours.   Past Week at Unknown time  . OXYGEN Inhale 4 L into the lungs daily.   03/06/2018 at Unknown time  . pantoprazole (PROTONIX) 40 MG tablet Take 40 mg by mouth daily.   03/05/2018 at Unknown time  . sevelamer (RENAGEL) 800 MG tablet Take 800 mg by mouth 3 (three) times daily with meals.   03/05/2018 at Unknown time  . sevelamer carbonate (RENVELA) 0.8 g PACK packet Take 0.8 g by mouth daily. To take with  largest meal of the day  3 unknown  . traZODone (DESYREL) 100 MG tablet Take 50 mg by mouth at bedtime.    03/05/2018 at Unknown time  . warfarin (COUMADIN) 4 MG tablet Take 4-5 mg by mouth See admin instructions. Alternates taking 4mg  with 5mg  daily in the evening   03/05/2018 at 2000    Assessment: Pharmacy consulted to dose heparin in patient with elevated troponins and subtherapeutic INR for mechanical mitral valve. INR 1.8 today, initial heparin level undetectable and warfarin is on hold for now. No issues with infusion per RN. Although INR close to 2 it will likely continue to drop while holding warfarin so will give small bolus in effort to make heparin therapeutic to protect mechanical valve.  Goal of Therapy:  Heparin level 0.3-0.7 units/ml Monitor platelets by anticoagulation protocol: Yes    Plan:  Heparin 2000 units x1 then increase to 1200/hr Recheck 8hr heparin level  Fredonia Highland, PharmD, BCPS Clinical Pharmacist [phone] Please check AMION for all [facility] Pharmacy numbers 03/06/2018

## 2018-03-06 NOTE — Progress Notes (Signed)
Dr. Margo Aye paged requesting for nausea medication.

## 2018-03-07 ENCOUNTER — Inpatient Hospital Stay (HOSPITAL_COMMUNITY): Payer: Medicare Other

## 2018-03-07 DIAGNOSIS — Z7901 Long term (current) use of anticoagulants: Secondary | ICD-10-CM

## 2018-03-07 DIAGNOSIS — R748 Abnormal levels of other serum enzymes: Secondary | ICD-10-CM

## 2018-03-07 LAB — BASIC METABOLIC PANEL
Anion gap: 17 — ABNORMAL HIGH (ref 5–15)
BUN: 56 mg/dL — AB (ref 6–20)
CHLORIDE: 97 mmol/L — AB (ref 98–111)
CO2: 23 mmol/L (ref 22–32)
Calcium: 9.5 mg/dL (ref 8.9–10.3)
Creatinine, Ser: 6.69 mg/dL — ABNORMAL HIGH (ref 0.44–1.00)
GFR calc Af Amer: 9 mL/min — ABNORMAL LOW (ref >60)
GFR calc non Af Amer: 8 mL/min — ABNORMAL LOW (ref >60)
Glucose, Bld: 98 mg/dL (ref 70–99)
POTASSIUM: 3.9 mmol/L (ref 3.5–5.1)
SODIUM: 137 mmol/L (ref 135–145)

## 2018-03-07 LAB — CBC
HEMATOCRIT: 33.8 % — AB (ref 36.0–46.0)
HEMOGLOBIN: 10.3 g/dL — AB (ref 12.0–15.0)
MCH: 33 pg (ref 26.0–34.0)
MCHC: 30.5 g/dL (ref 30.0–36.0)
MCV: 108.3 fL — ABNORMAL HIGH (ref 80.0–100.0)
NRBC: 0.3 % — AB (ref 0.0–0.2)
Platelets: 138 10*3/uL — ABNORMAL LOW (ref 150–400)
RBC: 3.12 MIL/uL — ABNORMAL LOW (ref 3.87–5.11)
RDW: 16.3 % — ABNORMAL HIGH (ref 11.5–15.5)
WBC: 6.8 10*3/uL (ref 4.0–10.5)

## 2018-03-07 LAB — TROPONIN I: Troponin I: 1.47 ng/mL (ref <0.03)

## 2018-03-07 LAB — HEPARIN LEVEL (UNFRACTIONATED)

## 2018-03-07 NOTE — Discharge Summary (Signed)
Discharge Summary  Marilyn Hanson WUJ:[id number] DOB: 1990-12-01  PCP: System, Pcp Not In  Admit date: 03/06/2018 Discharge date: 03/07/2018  PATIENT LEFT AGAINST MEDICAL ADVICE  Discharge Diagnoses:  Active Hospital Problems   Diagnosis Date Noted  . Chest pain 03/06/2018  . Anticoagulated on warfarin   . Elevated lipase   . Elevated troponin 03/06/2018  . Supraventricular tachycardia (HCC) 03/06/2018  . 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 Hypertension   . IgA nephropathy   . ESRD on hemodialysis (HCC)   . Obesity (BMI 30-39.9)   . Tricuspid regurgitation     Resolved Hospital Problems  No resolved problems to display.    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 Presented to [facility] yesterday with complaints of chest pain or shortness of breath.  Initial troponin 0 0.07, she later decided to leave AMA.  She then presented to [facility] emergency department on 03/06/2018 with persistent shortness of breath, chest pain and palpitations.  Then the ED heart rate in the 150s was given adenosine with resolution of her tachycardia.  Subsequent troponin and antipain was 1.72.  TRH asked to admit.  03/07/2018: Patient seen and examined at bedside.  No acute events overnight.  Had hemodialysis today.  This morning she reports hurting all over.  Hospital Course:  Principal Problem:   Chest  pain Active Problems:   Hypertension   IgA nephropathy   ESRD on hemodialysis (HCC)   Obesity (BMI 30-39.9)   Tricuspid regurgitation   Mechanical heart valve present   Elevated troponin   Supraventricular tachycardia (HCC)   Anticoagulated on warfarin   Elevated lipase  NSTEMI Cardiology consulted and following Continue heparin drip Management per cardiology Troponin peaked at 2.39 and trending down Twelve-lead EKG independently reviewed revealed sinus tachycardia with rate of 123 and QTC prolongation 503 no specific ST-T changes.  QTC prolongation QTC 503.  Twelve-lead EKG done on 03/06/2018 Repeat twelve-lead EKG Avoid QTC prolonging agents  End-stage renal disease on dialysis Tuesday Thursday Saturday On hemodialysis today Nephrology following Monitor volume status and electrolytes  History of chronic diastolic CHF Continue cardiac medications Obtain 2D echo  Hypertension Vital signs are stable Continue antihypertensive medications  History of IgA nephropathy now ESRD on dialysis -Noted  History of hepatitis C -Noted  Stasis edema of lower extremity Wound care specialist consulted  Chronic anxiety On Klonopin 0.5 mg twice daily  Status post mechanical mitral valve replacement in 2015 Chronically on Coumadin   Code Status: Full code  Family Communication: None at bedside  Disposition Plan: Home when clinically stable   Consultants:  Cardiology  Nephrology  Procedures:  Hemodialysis on 03/07/2018  Antimicrobials:  None  DVT prophylaxis: Heparin drip    Discharge Exam: BP 105/74 (BP Location: Right Arm)   Pulse 85   Temp 97.8 F (36.6 C) (Oral)   Resp 18   Ht 5\' 6"  (1.676 m)   Wt 87.6 kg  SpO2 91%   BMI 31.17 kg/m   General: 27 y.o. year-old female well developed well nourished in no acute distress.  Alert and oriented x3.  Cardiovascular: Regular rate and rhythm with no rubs or gallops.  No thyromegaly  or JVD noted.    Respiratory: Clear to auscultation with no wheezes or rales. Good inspiratory effort.  Abdomen: Soft nontender nondistended with normal bowel sounds x4 quadrants.  Musculoskeletal: Plus pitting edema in lower extremities bilaterally.  Moves all 4 extremities.  Psychiatry: Mood is anxious.  Discharge Instructions You were cared for by a hospitalist during your hospital stay. If you have any questions about your discharge medications or the care you received while you were in the hospital after you are discharged, you can call the unit and asked to speak with the hospitalist on call if the hospitalist that took care of you is not available. Once you are discharged, your primary care physician will handle any further medical issues. Please note that NO REFILLS for any discharge medications will be authorized once you are discharged, as it is imperative that you return to your primary care physician (or establish a relationship with a primary care physician if you do not have one) for your aftercare needs so that they can reassess your need for medications and monitor your lab values.   Allergies as of 03/07/2018      Reactions   Bee Venom Anaphylaxis   Note - tolerates medihoney w/no reaction   Topiramate Hives, Shortness Of Breath, Swelling   Azithromycin Itching, Rash   Gentamicin Rash   Nitroglycerin Other (See Comments)   SEVERE ALLERGY Unclear. Cant remember. " vomited black and nearly died/low blood pressure"   Nsaids Other (See Comments)   Vancomycin Itching, Rash, Swelling   Per pt report   Asa [aspirin] Itching   Ondansetron Hcl Hives   Ibuprofen Itching, Swelling, Rash   Penicillins Itching, Swelling, Rash   Sulfamethoxazole-trimethoprim Rash      Medication List    STOP taking these medications   ondansetron 8 MG disintegrating tablet Commonly known as:  ZOFRAN-ODT   sevelamer 800 MG tablet Commonly known as:  RENAGEL   sevelamer carbonate 0.8 g  Pack packet Commonly known as:  RENVELA   traZODone 100 MG tablet Commonly known as:  DESYREL     TAKE these medications   AURYXIA 1 GM 210 MG(Fe) tablet Generic drug:  ferric citrate Take 2 tablets by mouth 3 (three) times daily with meals.   clonazePAM 0.5 MG tablet Commonly known as:  KLONOPIN Take 0.5 mg by mouth 2 (two) times daily.   diphenhydrAMINE 25 MG tablet Commonly known as:  BENADRYL Take 25-50 mg by mouth every 6 (six) hours as needed for itching.   epoetin alfa 2000 UNIT/ML injection Commonly known as:  EPOGEN,PROCRIT Inject 2,000 Units into the skin Every Tuesday,Thursday,and Saturday with dialysis.   HYDROmorphone 2 MG tablet Commonly known as:  DILAUDID Take 2 mg by mouth every 6 (six) hours as needed for severe pain.   metoprolol tartrate 50 MG tablet Commonly known as:  LOPRESSOR Take 1 tablet (50 mg total) by mouth 2 (two) times daily.   naloxone 0.4 MG/ML injection Commonly known as:  NARCAN Place 0.4 mg into the nose once as needed (for opiod overdose).   OXYCONTIN 10 mg 12 hr tablet Generic drug:  oxyCODONE Take 10 mg by mouth every 12 (twelve) hours.   OXYGEN Inhale 4 L into the lungs daily.   pantoprazole  40 MG tablet Commonly known as:  PROTONIX Take 40 mg by mouth daily.   warfarin 4 MG tablet Commonly known as:  COUMADIN Take 4-5 mg by mouth See admin instructions. Alternates taking 4mg  with 5mg  daily in the evening      Allergies  Allergen Reactions  . Bee Venom Anaphylaxis    Note - tolerates medihoney w/no reaction  . Topiramate Hives, Shortness Of Breath and Swelling  . Azithromycin Itching and Rash  . Gentamicin Rash  . Nitroglycerin Other (See Comments)    SEVERE ALLERGY Unclear. Cant remember. " vomited black and nearly died/low blood pressure"   . Nsaids Other (See Comments)  . Vancomycin Itching, Rash and Swelling    Per pt report   . Asa [Aspirin] Itching  . Ondansetron Hcl Hives  . Ibuprofen Itching,  Swelling and Rash  . Penicillins Itching, Swelling and Rash  . Sulfamethoxazole-Trimethoprim Rash      The results of significant diagnostics from this hospitalization (including imaging, microbiology, ancillary and laboratory) are listed below for reference.    Significant Diagnostic Studies: Ct Abdomen Pelvis Wo Contrast  Result Date: 02/28/2018 CLINICAL DATA:  Patient with altered mental status. EXAM: CT ABDOMEN AND PELVIS WITHOUT CONTRAST TECHNIQUE: Multidetector CT imaging of the abdomen and pelvis was performed following the standard protocol without IV contrast. COMPARISON:  None. FINDINGS: Lower chest: Cardiomegaly. Patchy ground-glass and consolidative opacities within the lower lobes bilaterally. Small bilateral pleural effusions. Hepatobiliary: Liver is low in attenuation compatible with steatosis. Gallbladder is unremarkable. No intrahepatic or extrahepatic biliary ductal dilatation. Pancreas: Calcifications throughout the pancreatic parenchyma. Spleen: Enlarged measuring approximately 15 cm. Adrenals/Urinary Tract: Adrenal glands are unremarkable. Native kidneys are atrophic. Stomach/Bowel: Normal morphology of the stomach. No abnormal bowel wall thickening or evidence for bowel obstruction. No free intraperitoneal air. Vascular/Lymphatic: Normal caliber abdominal aorta. Peripheral calcified atherosclerotic plaque. Bulky retroperitoneal adenopathy. Reference 2.3 cm left periaortic lymph node (image 44; series 2). Reference 1.2 cm aortocaval node (image 43; series 2). Prominent and mildly enlarged lymph nodes extend distally along the aorta and iliac branches. Multiple prominent and enlarged mesenteric lymph nodes are demonstrated including a 1.4 cm node (image 45; series 2) within the small bowel mesentery. Reproductive: Calcified vascularity in the uterus. Other: Moderate volume ascites. Musculoskeletal: Lumbar spine degenerative changes. Soft tissue calcification posterior to the proximal  right femur (image 97; series 2). IMPRESSION: 1. Extensive retroperitoneal and small bowel mesenteric adenopathy. Findings are nonspecific and may potentially be reactive in etiology however the possibility of metastatic disease or lymphoma are considerations. 2. Splenomegaly. 3. Moderate volume ascites. 4. Atrophic native kidneys. 5. Hepatic steatosis. Electronically Signed   By: Annia Belt M.D.   On: 02/28/2018 18:12   Dg Chest 1 View  Result Date: 02/28/2018 CLINICAL DATA:  Altered mental status. EXAM: CHEST  1 VIEW COMPARISON:  None. FINDINGS: Low lung volumes. Probable elevation of the RIGHT hemidiaphragm. Lungs appear grossly clear. No pneumothorax. Suspected cardiomegaly, difficult to characterize due to the low lung volumes and lordotic patient positioning. Median sternotomy wires appear intact and normally aligned. No acute or suspicious osseous finding. IMPRESSION: Low lung volumes. No evidence of pneumonia or pulmonary edema seen. Probable cardiomegaly. Electronically Signed   By: Bary Richard M.D.   On: 02/28/2018 17:37   Ct Head Wo Contrast  Result Date: 02/28/2018 CLINICAL DATA:  Fatigue and malaise EXAM: CT HEAD WITHOUT CONTRAST TECHNIQUE: Contiguous axial images were obtained from the base of the skull through the vertex without intravenous contrast.  COMPARISON:  None. FINDINGS: Brain: No evidence of acute infarction, hemorrhage, hydrocephalus, extra-axial collection or mass lesion/mass effect. Chronic right occipital lobe infarct is identified with laminar calcifications. There is mild diffuse low-attenuation within the subcortical and periventricular white matter compatible with chronic microvascular disease. Vascular: No hyperdense vessel or unexpected calcification. Skull: Normal. Negative for fracture or focal lesion. Sinuses/Orbits: No acute finding. Other: None. IMPRESSION: 1. No acute intracranial abnormalities. 2. Chronic small vessel ischemic change. 3. Chronic right occipital  lobe infarct. Electronically Signed   By: Signa Kell M.D.   On: 02/28/2018 17:36   Dg Chest Port 1 View  Result Date: 03/06/2018 CLINICAL DATA:  Chest pain. EXAM: PORTABLE CHEST 1 VIEW COMPARISON:  Radiographs 6 days prior 02/28/2018 FINDINGS: Post median sternotomy. Cardiomegaly is unchanged with prosthetic mitral valve. Low lung volumes with bronchovascular crowding and peribronchial thickening. Possible small pleural effusions. No pneumothorax. IMPRESSION: 1. Cardiomegaly. Low lung volumes with bronchovascular crowding. Peribronchial thickening may be bronchitic or congestive. 2. Possible small pleural effusions. Electronically Signed   By: Narda Rutherford M.D.   On: 03/06/2018 06:55    Microbiology: Recent Results (from the past 240 hour(s))  MRSA PCR Screening     Status: None   Collection Time: 02/28/18  9:36 PM  Result Value Ref Range Status   MRSA by PCR NEGATIVE NEGATIVE Final    Comment:        The GeneXpert MRSA Assay (FDA approved for NASAL specimens only), is one component of a comprehensive MRSA colonization surveillance program. It is not intended to diagnose MRSA infection nor to guide or monitor treatment for MRSA infections. Performed at [facility], [address] [place], [place] [address]   Culture, blood (Routine X 2) w Reflex to ID Panel     Status: None (Preliminary result)   Collection Time: 03/06/18 11:35 AM  Result Value Ref Range Status   Specimen Description BLOOD RIGHT ANTECUBITAL  Final   Special Requests   Final    BOTTLES DRAWN AEROBIC AND ANAEROBIC Blood Culture adequate volume   Culture   Final    NO GROWTH < 24 HOURS Performed at [facility], [address] [place], [place] [address]    Report Status PENDING  Incomplete  Culture, blood (Routine X 2) w Reflex to ID Panel     Status: None (Preliminary result)   Collection Time: 03/06/18 11:40 AM  Result Value Ref Range Status   Specimen Description BLOOD BLOOD RIGHT HAND  Final    Special Requests   Final    BOTTLES DRAWN AEROBIC AND ANAEROBIC Blood Culture adequate volume   Culture   Final    NO GROWTH < 24 HOURS Performed at [facility], [address] [place], [place] [address]    Report Status PENDING  Incomplete     Labs: Basic Metabolic Panel: Recent Labs  Lab 03/01/18 0446 03/06/18 0625 03/07/18 0330  NA 139 139 137  K 4.6 3.7 3.9  CL 98 101 97*  CO2 21* 26 23  GLUCOSE 99 109* 98  BUN 84* 43* 56*  CREATININE 10.00* 5.40* 6.69*  CALCIUM 8.9 9.4 9.5   Liver Function Tests: Recent Labs  Lab 03/01/18 0446 03/06/18 0625  AST 46* 34  ALT 31 22  ALKPHOS 290* 252*  BILITOT 3.7* 3.4*  PROT 8.3* 8.4*  ALBUMIN 3.3* 3.4*   Recent Labs  Lab 03/06/18 0625  LIPASE 114*   Recent Labs  Lab 02/28/18 1750 03/01/18 0446  AMMONIA 90* 127*  CBC: Recent Labs  Lab 03/01/18 0446 03/06/18 0625 03/07/18 0330  WBC 4.7 6.0 6.8  NEUTROABS  --  3.4  --   HGB 11.3* 11.5* 10.3*  HCT 35.2* 37.9 33.8*  MCV 105.1* 108.6* 108.3*  PLT 129* 133* 138*   Cardiac Enzymes: Recent Labs  Lab 03/06/18 0625 03/06/18 1418 03/06/18 1908 03/07/18 0330  TROPONINI 1.72* 2.39* 2.01* 1.47*   BNP: BNP (last 3 results) No results for input(s): BNP in the last 8760 hours.  ProBNP (last 3 results) No results for input(s): PROBNP in the last 8760 hours.  CBG: Recent Labs  Lab 03/06/18 1203  GLUCAP 110*       Signed:  Darlin Drop, MD [facility] 03/07/2018, 3:47 PM

## 2018-03-07 NOTE — Progress Notes (Signed)
I visited Marilyn Hanson today however she did not want to discuss her care today.  I explained to her that we would like to obtain an echocardiogram to evaluate her mechanical mitral prosthesis in the setting of a subtherapeutic INR and an appropriately soft valve closure sound at S1, however she declined echo today.  If she chooses to continue her care in hospital, I would recommend that we complete an echocardiogram and continue heparin for anticoagulation in the setting of her subtherapeutic INR.  I was informed by the patient's nurse that she is planning to leave AMA.

## 2018-03-07 NOTE — Progress Notes (Addendum)
ANTICOAGULATION CONSULT NOTE - Follow Up Consult  Pharmacy Consult for heparin Indication: ACS/STEMI and mechanical MVR  Allergies  Allergen Reactions  . Bee Venom Anaphylaxis    Note - tolerates medihoney w/no reaction  . Topiramate Hives, Shortness Of Breath and Swelling  . Azithromycin Itching and Rash  . Gentamicin Rash  . Nitroglycerin Other (See Comments)    SEVERE ALLERGY Unclear. Cant remember. " vomited black and nearly died/low blood pressure"   . Nsaids Other (See Comments)  . Vancomycin Itching, Rash and Swelling    Per pt report   . Asa [Aspirin] Itching  . Ondansetron Hcl Hives  . Ibuprofen Itching, Swelling and Rash  . Penicillins Itching, Swelling and Rash  . Sulfamethoxazole-Trimethoprim Rash    Patient Measurements: Height: 5\' 6"  (167.6 cm) Weight: 193 lb 2 oz (87.6 kg) IBW/kg (Calculated) : 59.3 Heparin Dosing Weight: 75 kg  Vital Signs: Temp: 97.8 F (36.6 C) (10/19 1237) Temp Source: Oral (10/19 1237) BP: 105/74 (10/19 1237) Pulse Rate: 85 (10/19 1237)  Labs: Recent Labs    03/06/18 0625 03/06/18 1418 03/06/18 1908 03/07/18 0330 03/07/18 1040  HGB 11.5*  --   --  10.3*  --   HCT 37.9  --   --  33.8*  --   PLT 133*  --   --  138*  --   LABPROT 20.6*  --   --   --   --   INR 1.80  --   --   --   --   HEPARINUNFRC  --   --  <0.10*  --  <0.10*  CREATININE 5.40*  --   --  6.69*  --   TROPONINI 1.72* 2.39* 2.01* 1.47*  --     Estimated Creatinine Clearance: 14 mL/min (A) (by C-G formula based on SCr of 6.69 mg/dL (H)).   Medications:  Medications Prior to Admission  Medication Sig Dispense Refill Last Dose  . AURYXIA 1 GM 210 MG(Fe) tablet Take 2 tablets by mouth 3 (three) times daily with meals.  11 unknown  . clonazePAM (KLONOPIN) 0.5 MG tablet Take 0.5 mg by mouth 2 (two) times daily.    03/05/2018 at Unknown time  . diphenhydrAMINE (BENADRYL) 25 MG tablet Take 25-50 mg by mouth every 6 (six) hours as needed for itching.     03/05/2018 at Unknown time  . epoetin alfa (EPOGEN,PROCRIT) 2000 UNIT/ML injection Inject 2,000 Units into the skin Every Tuesday,Thursday,and Saturday with dialysis.    Past Week at Unknown time  . HYDROmorphone (DILAUDID) 2 MG tablet Take 2 mg by mouth every 6 (six) hours as needed for severe pain.   03/05/2018 at Unknown time  . metoprolol tartrate (LOPRESSOR) 50 MG tablet Take 1 tablet (50 mg total) by mouth 2 (two) times daily. 60 tablet 0 03/05/2018 at 0800  . naloxone [facility]) 0.4 MG/ML injection Place 0.4 mg into the nose once as needed (for opiod overdose).    unknown  . ondansetron (ZOFRAN-ODT) 8 MG disintegrating tablet Take 8 mg by mouth every 8 (eight) hours as needed for nausea or vomiting.   03/05/2018 at Unknown time  . oxyCODONE (OXYCONTIN) 10 mg 12 hr tablet Take 10 mg by mouth every 12 (twelve) hours.   Past Week at Unknown time  . OXYGEN Inhale 4 L into the lungs daily.   03/06/2018 at Unknown time  . pantoprazole (PROTONIX) 40 MG tablet Take 40 mg by mouth daily.   03/05/2018 at Unknown time  . sevelamer (RENAGEL)  800 MG tablet Take 800 mg by mouth 3 (three) times daily with meals.   03/05/2018 at Unknown time  . sevelamer carbonate (RENVELA) 0.8 g PACK packet Take 0.8 g by mouth daily. To take with largest meal of the day  3 unknown  . traZODone (DESYREL) 100 MG tablet Take 50 mg by mouth at bedtime.    03/05/2018 at Unknown time  . warfarin (COUMADIN) 4 MG tablet Take 4-5 mg by mouth See admin instructions. Alternates taking 4mg  with 5mg  daily in the evening   03/05/2018 at 2000    Assessment: Pharmacy consulted to dose heparin in patient with elevated troponins and subtherapeutic INR for mechanical mitral valve. INR 1.8 on admission - warfarin remains on hold.   Bleeding noted by nursing overnight and earlier today- now resolved. Hemodialysis was stopped early given drops in BP. Initial heparin level drawn in HD came back undetectable again despite rate increase. Troponin  trending downward.   Goal of Therapy:  Heparin level 0.3-0.7 units/ml Monitor platelets by anticoagulation protocol: Yes   Plan:  Increase heparin infusion to 1350/hr Recheck 8hr heparin level Follow-up plans to resume warfarin therapy (daily INR entered)  Girard Cooter, PharmD Clinical Pharmacist  Pager: [phone] Phone: [phone] Please check AMION for all [facility] numbers 03/07/2018

## 2018-03-07 NOTE — Progress Notes (Signed)
[facility] Progress Note  Subjective: having troubles on dialyiss due to frequent loose stools (from lactulose per the pt) and having to void.  Had to come off early today from HD. Unable to get much fluid due to severe low BP drops.   Vitals:   03/07/18 0830 03/07/18 0930 03/07/18 1000 03/07/18 1030  BP: (!) 84/53 (!) 88/54 (!) 100/38 (!) 88/28  Pulse: 83 88 80 82  Resp:      Temp:      TempSrc:      SpO2:      Weight:      Height:        Inpatient medications: . Chlorhexidine Gluconate Cloth  6 each Topical Q0600  . clonazePAM  0.5 mg Oral BID  . metoprolol tartrate  50 mg Oral BID  . oxyCODONE  10 mg Oral Q12H  . pantoprazole  40 mg Oral Daily  . sevelamer carbonate  800 mg Oral TID WC  . sodium chloride flush  3 mL Intravenous Q12H   . sodium chloride    . heparin 1,200 Units/hr (03/07/18 0215)   sodium chloride, acetaminophen **OR** acetaminophen, HYDROmorphone (DILAUDID) injection, ondansetron (ZOFRAN) IV, senna-docusate, sodium chloride flush  Iron/TIBC/Ferritin/ %Sat No results found for: IRON, TIBC, FERRITIN, IRONPCTSAT  Exam: Gen anxious, no distress, nasal O2 No jvd or bruits Chest clear bilat no rales or wheezing L breast lesion w/ eschar RRR w/ PV click no RG Abd soft ntnd no mass or ascites +bs  Ext 2+ chronic tense LE edema Neuro is alert, Ox 3 , nf LUA AVF+bruit    Dialysis:  TTS [facility]  4h 83kg  2/2 bath  P4  LUA AVF  Heparin none  - mircera 75 q 4    Impression/ Plan: 1. Chest pain - per primary 2. SVT - sp adenosine in ED 3. ESRD on HD TTS.  Partial HD today due to diarrhea.  4. 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 Anemia ckd - Hb 11.5, observe 7. Vol overload- chronic LE edema, unable to pull fluid today due to bp drops  Marilyn Moselle MD [facility] pager [phone]   03/07/2018, 11:11 AM   Recent Labs  Lab 03/01/18 0446 03/01/18 0851 03/06/18 0625  03/07/18 0330  NA 139  --  139 137  K 4.6  --  3.7 3.9  CL 98  --  101 97*  CO2 21*  --  26 23  GLUCOSE 99  --  109* 98  BUN 84*  --  43* 56*  CREATININE 10.00*  --  5.40* 6.69*  CALCIUM 8.9  --  9.4 9.5  ALBUMIN 3.3*  --  3.4*  --   INR  --  2.67 1.80  --    Recent Labs  Lab 03/01/18 0446 03/06/18 0625  AST 46* 34  ALT 31 22  ALKPHOS 290* 252*  BILITOT 3.7* 3.4*  PROT 8.3* 8.4*   Recent Labs  Lab 02/28/18 1521  03/06/18 0625 03/07/18 0330  WBC 4.2   < > 6.0 6.8  NEUTROABS 2.5  --  3.4  --   HGB 11.4*   < > 11.5* 10.3*  HCT 35.8*   < > 37.9 33.8*  MCV 102.9*   < > 108.6* 108.3*  PLT 120*   < > 133* 138*   < > = values in this interval not displayed.

## 2018-03-07 NOTE — Progress Notes (Signed)
PROGRESS NOTE  Marilyn Hanson BJY:[id number] DOB: 12-10-1990 DOA: 03/06/2018 PCP: System, Pcp Not In  HPI/Recap of past 24 hours: Marilyn Hanson is a 27 y.o. 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 Presented to [facility] yesterday with complaints of chest pain or shortness of breath.  Initial troponin 0 0.07, she later decided to leave AMA.  She then presented to [facility] emergency department on 03/06/2018 with persistent shortness of breath, chest pain and palpitations.  Then the ED heart rate in the 150s was given adenosine with resolution of her tachycardia.  Subsequent troponin and antipain was 1.72.   TRH asked to admit.  03/07/2018: Patient seen and examined at bedside.  No acute events overnight.  Had hemodialysis today.  This morning she reports hurting all over.    Assessment/Plan: Principal Problem:   Chest pain Active Problems:   Hypertension   IgA nephropathy   ESRD on hemodialysis (HCC)   Obesity (BMI 30-39.9)   Tricuspid regurgitation   Mechanical heart valve present   Elevated troponin   Supraventricular tachycardia (HCC)   NSTEMI Cardiology consulted and following Continue heparin drip Management per cardiology Troponin peaked at 2.39 and trending down Twelve-lead EKG independently reviewed revealed sinus tachycardia with rate of 123 and QTC prolongation 503 no specific ST-T changes.  QTC prolongation QTC 503.  Twelve-lead EKG done on 03/06/2018 Repeat twelve-lead EKG Avoid QTC prolonging agents  End-stage renal disease on dialysis Tuesday Thursday  Saturday On hemodialysis today Nephrology following Monitor volume status and electrolytes  History of chronic diastolic CHF Continue cardiac medications Obtain 2D echo  Hypertension Vital signs are stable Continue antihypertensive medications  History of IgA nephropathy now ESRD on dialysis -Noted  History of hepatitis C -Noted  Stasis edema of lower extremity Wound care specialist consulted  Chronic anxiety On Klonopin 0.5 mg twice daily  Status post mechanical mitral valve replacement in 2015 Chronically on Coumadin   Code Status: Full code  Family Communication: None at bedside  Disposition Plan: Home when clinically stable   Consultants:  Cardiology  Nephrology  Procedures:  Hemodialysis on 03/07/2018  Antimicrobials:  None  DVT prophylaxis: Heparin drip   Objective: Vitals:   03/07/18 1100 03/07/18 1140 03/07/18 1236 03/07/18 1237  BP: (!) 89/47 (!) 96/54 105/74 105/74  Pulse: 79 82 85 85  Resp:  18    Temp:  97.6 F (36.4 C)  97.8 F (36.6 C)  TempSrc:  Oral  Oral  SpO2:  100%  91%  Weight:  87.6 kg    Height:        Intake/Output Summary (Last 24 hours) at 03/07/2018 1320 Last data filed at 03/07/2018 1140 Gross per 24 hour  Intake -  Output 2117 ml  Net -2117 ml   Filed Weights   03/06/18 1347 03/07/18 0721 03/07/18 1140  Weight: 89.9 kg 89.6 kg 87.6 kg    Exam:  . General: 27 y.o. year-old female well developed well nourished in no acute distress.  Alert and oriented x3. . Cardiovascular: Regular rate and rhythm with no rubs or gallops.  No thyromegaly or JVD noted.   Marland Kitchen Respiratory: Clear to auscultation with no wheezes or rales.  Good inspiratory effort. . Abdomen: Soft nontender nondistended with normal bowel sounds x4 quadrants. . Musculoskeletal: Plus pitting edema in lower extremities bilaterally.  Moves all 4 extremities. Marland Kitchen Psychiatry: Mood is anxious.   Data Reviewed: CBC: Recent Labs  Lab 02/28/18 1521  03/01/18 0446 03/06/18 0625 03/07/18 0330  WBC 4.2 4.7 6.0 6.8  NEUTROABS 2.5  --  3.4  --   HGB 11.4* 11.3* 11.5* 10.3*  HCT 35.8* 35.2* 37.9 33.8*  MCV 102.9* 105.1* 108.6* 108.3*  PLT 120* 129* 133* 138*   Basic Metabolic Panel: Recent Labs  Lab 02/28/18 1521 03/01/18 0446 03/06/18 0625 03/07/18 0330  NA 133* 139 139 137  K 4.5 4.6 3.7 3.9  CL 95* 98 101 97*  CO2 17* 21* 26 23  GLUCOSE 99 99 109* 98  BUN 76* 84* 43* 56*  CREATININE 8.95* 10.00* 5.40* 6.69*  CALCIUM 8.7* 8.9 9.4 9.5   GFR: Estimated Creatinine Clearance: 14 mL/min (A) (by C-G formula based on SCr of 6.69 mg/dL (H)). Liver Function Tests: Recent Labs  Lab 02/28/18 1521 03/01/18 0446 03/06/18 0625  AST 50* 46* 34  ALT 34 31 22  ALKPHOS 318* 290* 252*  BILITOT 4.3* 3.7* 3.4*  PROT 8.8* 8.3* 8.4*  ALBUMIN 3.7 3.3* 3.4*   Recent Labs  Lab 02/28/18 1521 03/06/18 0625  LIPASE 31 114*   Recent Labs  Lab 02/28/18 1750 03/01/18 0446  AMMONIA 90* 127*   Coagulation Profile: Recent Labs  Lab 02/28/18 1521 03/01/18 0851 03/06/18 0625  INR 3.92 2.67 1.80   Cardiac Enzymes: Recent Labs  Lab 02/28/18 1521 03/06/18 0625 03/06/18 1418 03/06/18 1908 03/07/18 0330  TROPONINI 0.07* 1.72* 2.39* 2.01* 1.47*   BNP (last 3 results) No results for input(s): PROBNP in the last 8760 hours. HbA1C: No results for input(s): HGBA1C in the last 72 hours. CBG: Recent Labs  Lab 03/06/18 1203  GLUCAP 110*   Lipid Profile: No results for input(s): CHOL, HDL, LDLCALC, TRIG, CHOLHDL, LDLDIRECT in the last 72 hours. Thyroid Function Tests: No results for input(s): TSH, T4TOTAL, FREET4, T3FREE, THYROIDAB in the last 72 hours. Anemia Panel: No results for input(s): VITAMINB12, FOLATE, FERRITIN, TIBC, IRON, RETICCTPCT in the last 72 hours. Urine analysis: No results found for: COLORURINE, APPEARANCEUR, LABSPEC, PHURINE, GLUCOSEU, HGBUR, BILIRUBINUR, KETONESUR, PROTEINUR, UROBILINOGEN, NITRITE,  LEUKOCYTESUR Sepsis Labs: @LABRCNTIP (procalcitonin:4,lacticidven:4)  ) Recent Results (from the past 240 hour(s))  MRSA PCR Screening     Status: None   Collection Time: 02/28/18  9:36 PM  Result Value Ref Range Status   MRSA by PCR NEGATIVE NEGATIVE Final    Comment:        The GeneXpert MRSA Assay (FDA approved for NASAL specimens only), is one component of a comprehensive MRSA colonization surveillance program. It is not intended to diagnose MRSA infection nor to guide or monitor treatment for MRSA infections. Performed at [facility], [address], [place], [place] [address]   Culture, blood (Routine X 2) w Reflex to ID Panel     Status: None (Preliminary result)   Collection Time: 03/06/18 11:35 AM  Result Value Ref Range Status   Specimen Description BLOOD RIGHT ANTECUBITAL  Final   Special Requests   Final    BOTTLES DRAWN AEROBIC AND ANAEROBIC Blood Culture adequate volume   Culture   Final    NO GROWTH < 24 HOURS Performed at [facility], [address] [place], [place] [address]    Report Status PENDING  Incomplete  Culture, blood (Routine  X 2) w Reflex to ID Panel     Status: None (Preliminary result)   Collection Time: 03/06/18 11:40 AM  Result Value Ref Range Status   Specimen Description BLOOD BLOOD RIGHT HAND  Final   Special Requests   Final    BOTTLES DRAWN AEROBIC AND ANAEROBIC Blood Culture adequate volume   Culture   Final    NO GROWTH < 24 HOURS Performed at [facility], [address], [place], [place] [address]    Report Status PENDING  Incomplete      Studies: No results found.  Scheduled Meds: . Chlorhexidine Gluconate Cloth  6 each Topical Q0600  . clonazePAM  0.5 mg Oral BID  . metoprolol tartrate  50 mg Oral BID  . oxyCODONE  10 mg Oral Q12H  . pantoprazole  40 mg Oral Daily  . sevelamer carbonate  800 mg Oral TID WC  . sodium chloride flush  3 mL Intravenous Q12H    Continuous Infusions: . sodium chloride    . heparin 1,200  Units/hr (03/07/18 0215)     LOS: 1 day     Darlin Drop, MD [facility] Pager [phone]  If 7PM-7AM, please contact night-coverage [email or web address] Password [email or web address] 03/07/2018, 1:20 PM

## 2018-03-07 NOTE — Progress Notes (Signed)
Pt indicated she wanted to go home AMA. Pt family came to pick up pt bringing in a wheelchair. AMA form completed and signed by pt.

## 2018-03-11 LAB — CULTURE, BLOOD (ROUTINE X 2)
CULTURE: NO GROWTH
CULTURE: NO GROWTH
SPECIAL REQUESTS: ADEQUATE
Special Requests: ADEQUATE

## 2018-03-26 ENCOUNTER — Emergency Department (HOSPITAL_COMMUNITY)
Admission: EM | Admit: 2018-03-26 | Discharge: 2018-03-26 | Disposition: A | Discharge status: Home / Self Care | Payer: Medicare Other | Attending: Emergency Medicine | Admitting: Emergency Medicine

## 2018-03-26 ENCOUNTER — Emergency Department (HOSPITAL_COMMUNITY): Payer: Medicare Other

## 2018-03-26 ENCOUNTER — Other Ambulatory Visit: Payer: Self-pay

## 2018-03-26 ENCOUNTER — Encounter (HOSPITAL_COMMUNITY): Payer: Self-pay | Admitting: Emergency Medicine

## 2018-03-26 DIAGNOSIS — T402X5A Adverse effect of other opioids, initial encounter: Secondary | ICD-10-CM | POA: Diagnosis not present

## 2018-03-26 DIAGNOSIS — R1084 Generalized abdominal pain: Secondary | ICD-10-CM | POA: Diagnosis present

## 2018-03-26 DIAGNOSIS — N186 End stage renal disease: Secondary | ICD-10-CM | POA: Insufficient documentation

## 2018-03-26 DIAGNOSIS — K5903 Drug induced constipation: Secondary | ICD-10-CM | POA: Diagnosis not present

## 2018-03-26 DIAGNOSIS — F17210 Nicotine dependence, cigarettes, uncomplicated: Secondary | ICD-10-CM | POA: Diagnosis not present

## 2018-03-26 DIAGNOSIS — K649 Unspecified hemorrhoids: Secondary | ICD-10-CM | POA: Insufficient documentation

## 2018-03-26 DIAGNOSIS — Z79899 Other long term (current) drug therapy: Secondary | ICD-10-CM | POA: Insufficient documentation

## 2018-03-26 DIAGNOSIS — Z992 Dependence on renal dialysis: Secondary | ICD-10-CM | POA: Insufficient documentation

## 2018-03-26 DIAGNOSIS — Z7901 Long term (current) use of anticoagulants: Secondary | ICD-10-CM | POA: Insufficient documentation

## 2018-03-26 DIAGNOSIS — I509 Heart failure, unspecified: Secondary | ICD-10-CM | POA: Diagnosis not present

## 2018-03-26 DIAGNOSIS — I132 Hypertensive heart and chronic kidney disease with heart failure and with stage 5 chronic kidney disease, or end stage renal disease: Secondary | ICD-10-CM | POA: Diagnosis not present

## 2018-03-26 LAB — CBC WITH DIFFERENTIAL/PLATELET
Abs Immature Granulocytes: 0.11 10*3/uL — ABNORMAL HIGH (ref 0.00–0.07)
BASOS ABS: 0.1 10*3/uL (ref 0.0–0.1)
BASOS PCT: 1 %
EOS ABS: 0.2 10*3/uL (ref 0.0–0.5)
EOS PCT: 2 %
HCT: 36.8 % (ref 36.0–46.0)
Hemoglobin: 11.2 g/dL — ABNORMAL LOW (ref 12.0–15.0)
IMMATURE GRANULOCYTES: 1 %
LYMPHS ABS: 1 10*3/uL (ref 0.7–4.0)
Lymphocytes Relative: 9 %
MCH: 33.7 pg (ref 26.0–34.0)
MCHC: 30.4 g/dL (ref 30.0–36.0)
MCV: 110.8 fL — AB (ref 80.0–100.0)
MONO ABS: 0.7 10*3/uL (ref 0.1–1.0)
MONOS PCT: 6 %
Neutro Abs: 9 10*3/uL — ABNORMAL HIGH (ref 1.7–7.7)
Neutrophils Relative %: 81 %
PLATELETS: 155 10*3/uL (ref 150–400)
RBC: 3.32 MIL/uL — ABNORMAL LOW (ref 3.87–5.11)
RDW: 19.4 % — ABNORMAL HIGH (ref 11.5–15.5)
WBC: 11 10*3/uL — ABNORMAL HIGH (ref 4.0–10.5)
nRBC: 0.5 % — ABNORMAL HIGH (ref 0.0–0.2)

## 2018-03-26 LAB — COMPREHENSIVE METABOLIC PANEL
ALBUMIN: 3.4 g/dL — AB (ref 3.5–5.0)
ALT: 23 U/L (ref 0–44)
ANION GAP: 16 — AB (ref 5–15)
AST: 38 U/L (ref 15–41)
Alkaline Phosphatase: 256 U/L — ABNORMAL HIGH (ref 38–126)
BUN: 35 mg/dL — AB (ref 6–20)
CHLORIDE: 94 mmol/L — AB (ref 98–111)
CO2: 25 mmol/L (ref 22–32)
Calcium: 9 mg/dL (ref 8.9–10.3)
Creatinine, Ser: 6.49 mg/dL — ABNORMAL HIGH (ref 0.44–1.00)
GFR calc Af Amer: 9 mL/min — ABNORMAL LOW (ref >60)
GFR, EST NON AFRICAN AMERICAN: 8 mL/min — AB (ref >60)
Glucose, Bld: 96 mg/dL (ref 70–99)
Potassium: 3.6 mmol/L (ref 3.5–5.1)
Sodium: 135 mmol/L (ref 135–145)
TOTAL PROTEIN: 8.6 g/dL — AB (ref 6.5–8.1)
Total Bilirubin: 4.4 mg/dL — ABNORMAL HIGH (ref 0.3–1.2)

## 2018-03-26 LAB — TROPONIN I: TROPONIN I: 0.41 ng/mL — AB (ref <0.03)

## 2018-03-26 LAB — HCG, QUANTITATIVE, PREGNANCY: HCG, BETA CHAIN, QUANT, S: 3 m[IU]/mL (ref <5)

## 2018-03-26 LAB — PROTIME-INR
INR: 2.09
Prothrombin Time: 23.2 seconds — ABNORMAL HIGH (ref 11.4–15.2)

## 2018-03-26 LAB — LIPASE, BLOOD: LIPASE: 32 U/L (ref 11–51)

## 2018-03-26 MED ORDER — HYDROCORTISONE 2.5 % RE CREA: TOPICAL_CREAM | RECTAL | 0 refills | Status: AC

## 2018-03-26 MED ORDER — LINACLOTIDE 145 MCG PO CAPS: 145.0000 ug | ORAL_CAPSULE | Freq: Every day | ORAL | 0 refills | Status: AC

## 2018-03-26 MED ORDER — MAGNESIUM CITRATE PO SOLN
1.0000 | Freq: Once | ORAL | Status: AC
  Administered 2018-03-26: 1 via ORAL
  Filled 2018-03-26: qty 296

## 2018-03-26 NOTE — ED Provider Notes (Signed)
[facility] EMERGENCY DEPARTMENT Provider Note   CSN: [id number] Arrival date & time: 03/26/18  1551     History   Chief Complaint Chief Complaint  Patient presents with  . Chest Pain  . Abdominal Pain    HPI Marilyn Hanson is a 27 y.o. female.  Pt presents to the ED today from dialysis with abdominal pain radiating up to her chest.  She has been constipated and has been nauseous.  She did not finish her full dialysis today.  The pt takes narcotics for chronic pain.  Pt has a very complex past medical history significant for ESRD with HD on Tu, TH, Sat due to IgA nephropathy.  She also had prosthetic mitral valve endocarditis in June with group B strep.  She did complete abx for that infection via Picc line.  She was admitted to [facility] on 10/18, but left AMA on 10/19.  She said she's been constipated for 2 days.  She also has a large hemorrhoid.  She said sx worsened while in dialysis.  She is anticoagulated on coumadin.     Past Medical History:  Diagnosis Date  . Anemia   . Anxiety   . Ascites   . Bacteremia due to group B Streptococcus 10/2017   [facility] admit  . CHF (congestive heart failure) (HCC)   . Chronic anticoagulation    coumadin  . Chronic edema    RLE>LLE  . Chronic pain syndrome   . Dysphagia   . Endocarditis 10/2017   [facility] admit  . ESRD on hemodialysis (HCC)   . Headache disorder   . Hemodialysis patient (HCC)   . High risk medication use    opiods, benzos  . Hypertension   . Hypertriglyceridemia   . IgA nephropathy   . IgA nephropathy   . IVDU (intravenous drug user) 10/2017   suspected, per [facility] admission notes  . Obesity (BMI 30-39.9)   . On home O2    3L N/C  . Positive hepatitis C antibody test   . Tricuspid regurgitation     Patient Active Problem List   Diagnosis Date Noted  . Anticoagulated on warfarin   . Elevated lipase   . Chest pain 03/06/2018  . Elevated troponin 03/06/2018  . Supraventricular tachycardia (HCC) 03/06/2018  .  Ischemic stroke (HCC) 02/28/2018  . Acute encephalopathy 02/28/2018  . 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 Metabolic acidosis 02/28/2018  . Hypertension   . CHF (congestive heart failure) (HCC)   . IgA nephropathy   . ESRD on hemodialysis (HCC)   . Positive hepatitis C antibody test   . Obesity (BMI 30-39.9)   . Tricuspid regurgitation   . Chronic anticoagulation     Past Surgical History:  Procedure Laterality Date  . MITRAL VALVE REPLACEMENT     mechanical     OB History   None      Home Medications    Prior to Admission medications   Medication Sig Start Date End Date Taking? Authorizing Provider  AURYXIA 1 GM 210 MG(Fe) tablet Take 2 tablets by mouth 3 (three) times daily with meals. 02/18/18   [provider]  clonazePAM (KLONOPIN) 0.5 MG tablet Take 0.5 mg by mouth 2 (two) times daily.     [provider]  diphenhydrAMINE (BENADRYL) 25 MG tablet Take 25-50 mg by mouth every 6 (six) hours as needed for itching.     [provider]  epoetin alfa (EPOGEN,PROCRIT) 2000 UNIT/ML injection Inject 2,000 Units into  the skin Every Tuesday,Thursday,and Saturday with dialysis.     [provider]  hydrocortisone (ANUSOL-HC) 2.5 % rectal cream Apply rectally 2 times daily 03/26/18   Jacalyn Lefevre, MD  HYDROmorphone (DILAUDID) 2 MG tablet Take 2 mg by mouth every 6 (six) hours as needed for severe pain.    [provider]  linaclotide Karlene Einstein) 145 MCG CAPS capsule Take 1 capsule (145 mcg total) by mouth daily before breakfast. 03/26/18   Jacalyn Lefevre, MD  metoprolol tartrate (LOPRESSOR) 50 MG tablet Take 1 tablet (50 mg total) by mouth 2 (two) times daily. 02/13/17   Raeford Razor, MD  naloxone [facility]) 0.4 MG/ML injection Place 0.4 mg into the nose once as needed (for opiod overdose).  08/25/15   [provider]  oxyCODONE (OXYCONTIN) 10 mg 12 hr tablet Take 10 mg by mouth every 12 (twelve) hours.    [provider]  OXYGEN Inhale 4 L into the lungs daily.    [provider]  pantoprazole (PROTONIX) 40 MG tablet Take 40 mg by mouth daily.    [provider]  warfarin (COUMADIN) 4 MG tablet Take 4-5 mg by mouth See admin instructions. Alternates taking 4mg  with 5mg  daily in the evening    [provider]    Family History Family History  Problem Relation Age of Onset  . Anxiety disorder Mother   . Hypertension Mother   . Healthy Sister   . Healthy Brother   . Melanoma Maternal Grandfather   . Healthy Paternal Grandmother        at age 45  . CAD Paternal Grandfather        in his 45's  . Healthy Sister   . Healthy Brother   . Healthy Brother     Social History Social History   Tobacco Use  . Smoking status: Current Every Day Smoker    Packs/day: 0.50  . Smokeless tobacco: Never Used  Substance Use Topics  . Alcohol use: No  . Drug use: No     Allergies   Bee venom; Topiramate; Azithromycin; Gentamicin; Nitroglycerin; Nsaids; Vancomycin; Asa [aspirin]; Ondansetron hcl; Ibuprofen; Penicillins; and Sulfamethoxazole-trimethoprim   Review of Systems Review of Systems  Gastrointestinal: Positive for abdominal pain, constipation and nausea.  All other systems reviewed and are negative.    Physical Exam Updated Vital Signs Pulse 99   Temp 97.9 F (36.6 C) (Oral)   Resp 14   SpO2 100%   Physical Exam  Constitutional: She is oriented to person, place, and time. She appears well-developed. She appears ill.  HENT:  Head: Normocephalic and atraumatic.  Eyes: Pupils are equal, round, and reactive to light. EOM are normal.  Neck: Normal range of motion. Neck supple.  Cardiovascular: Normal rate, regular rhythm, intact distal pulses and normal pulses.  Pulmonary/Chest: Effort normal and breath sounds normal.  Abdominal: Soft. She exhibits ascites. Bowel sounds are decreased.  Musculoskeletal:       Right lower leg: She exhibits edema.       Left lower  leg: She exhibits edema.  Neurological: She is alert and oriented to person, place, and time.  Skin: Skin is warm and dry. Capillary refill takes less than 2 seconds.  Psychiatric: She has a normal mood and affect. Her behavior is normal.  Nursing note and vitals reviewed.    ED Treatments / Results  Labs (all labs ordered are listed, but only abnormal results are displayed) Labs Reviewed  CBC WITH DIFFERENTIAL/PLATELET - Abnormal; Notable  for the following components:      Result Value   WBC 11.0 (*)    RBC 3.32 (*)    Hemoglobin 11.2 (*)    MCV 110.8 (*)    RDW 19.4 (*)    nRBC 0.5 (*)    Neutro Abs 9.0 (*)    Abs Immature Granulocytes 0.11 (*)    All other components within normal limits  COMPREHENSIVE METABOLIC PANEL - Abnormal; Notable for the following components:   Chloride 94 (*)    BUN 35 (*)    Creatinine, Ser 6.49 (*)    Total Protein 8.6 (*)    Albumin 3.4 (*)    Alkaline Phosphatase 256 (*)    Total Bilirubin 4.4 (*)    GFR calc non Af Amer 8 (*)    GFR calc Af Amer 9 (*)    Anion gap 16 (*)    All other components within normal limits  PROTIME-INR - Abnormal; Notable for the following components:   Prothrombin Time 23.2 (*)    All other components within normal limits  TROPONIN I - Abnormal; Notable for the following components:   Troponin I 0.41 (*)    All other components within normal limits  LIPASE, BLOOD  HCG, QUANTITATIVE, PREGNANCY    EKG EKG Interpretation  Date/Time:  Thursday March 26 2018 16:28:48 EST Ventricular Rate:  98 PR Interval:    QRS Duration: 122 QT Interval:  406 QTC Calculation: 518 R Axis:   131 Text Interpretation:  Sinus rhythm with 1st degree A-V block with frequent Premature ventricular complexes Right axis deviation Non-specific intra-ventricular conduction delay Abnormal ECG No significant change since last tracing Confirmed by Jacalyn Lefevre [phone]) on 03/26/2018 4:51:43 PM   Radiology Dg Abdomen Acute  W/chest  Result Date: 03/26/2018 CLINICAL DATA:  Patient was in dialysis today and began having chest and abdomen pain. Hx of ascites, CHF, endocarditits, ESRD, HTN, hepatitis, mitral valve replacement, and current smoker. EXAM: DG ABDOMEN ACUTE W/ 1V CHEST COMPARISON:  03/06/2018. FINDINGS: Moderate increased stool burden throughout the colon. No bowel dilation to suggest obstruction. No generalized adynamic ileus. No free air. Splenomegaly as noted on the recent prior CT. No evidence of renal or ureteral stones. Changes from prior cardiac surgery and valve replacement. No mediastinal or hilar masses. Lung volumes are low. Are prominent bronchovascular markings. No convincing pneumonia or pulmonary edema. IMPRESSION: 1. No acute findings.  No evidence of bowel obstruction or free air. 2. Moderate generalized increased stool burden throughout the colon. 3. No acute cardiopulmonary disease. Electronically Signed   By: Amie Portland M.D.   On: 03/26/2018 18:54    Procedures Procedures (including critical care time)  Medications Ordered in ED Medications  magnesium citrate solution 1 Bottle (has no administration in time range)     Initial Impression / Assessment and Plan / ED Course  I have reviewed the triage vital signs and the nursing notes.  Pertinent labs & imaging results that were available during my care of the patient were reviewed by me and considered in my medical decision making (see chart for details).    Troponin is elevated, but much lower than it's been.  It was 1.47 on 10/19.  Other labs are all chronically abnormal.  The pt is constipated, but refuses an enema.  Constipation likely due to large amts of opiates.  She requested narcotics here and was angry when I told her that would make her sx worse.  She will be started on  linzess as she's already taking lactulose.  She is given a bottle of mg ci prior to d/c.  She knows to return if worse.  Final Clinical Impressions(s) / ED  Diagnoses   Final diagnoses:  Drug-induced constipation  Hemorrhoids, unspecified hemorrhoid type  ESRD on hemodialysis [facility])    ED Discharge Orders         Ordered    linaclotide (LINZESS) 145 MCG CAPS capsule  Daily before breakfast     03/26/18 1907    hydrocortisone (ANUSOL-HC) 2.5 % rectal cream     03/26/18 1907           Jacalyn Lefevre, MD 03/26/18 1920

## 2018-03-26 NOTE — ED Notes (Signed)
CRITICAL VALUE ALERT  Critical Value:  Troponin 0.41  Date & Time Notied:  03/26/18 1808  Provider Notified: Yes   Orders Received/Actions taken: Nothing yet

## 2018-03-26 NOTE — ED Triage Notes (Signed)
Pt states that she was in diaylsis and started having abd and chest pains

## 2018-04-10 ENCOUNTER — Encounter (INDEPENDENT_AMBULATORY_CARE_PROVIDER_SITE_OTHER): Payer: Medicare Other | Admitting: Vascular Surgery

## 2018-04-19 DEATH — deceased

## 2020-05-21 IMAGING — CT CT HEAD W/O CM
3 series · 15 of 47 positions shown, 18 images · non-contrast
Comparison: None.

CLINICAL DATA: Fatigue and malaise

EXAM:
CT HEAD WITHOUT CONTRAST
TECHNIQUE: Contiguous axial images were obtained from the base of the skull
through the vertex without intravenous contrast.

[Series 2: head trauma wo · axial · 0.42mm/px · z∈[-225,-100]mm · 9 of 30 slices shown, 12 images]
[im 3/30  brain]
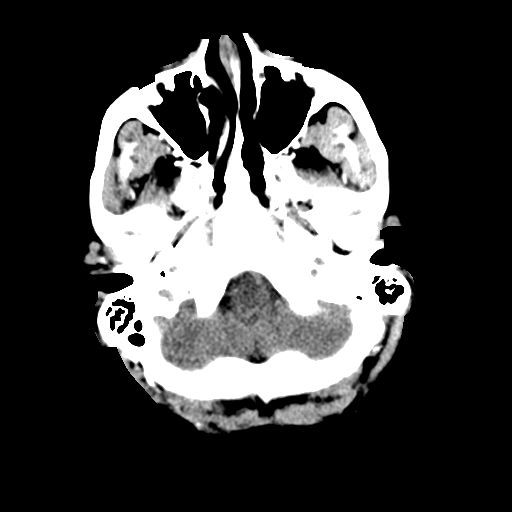
[im 3/30  bone]
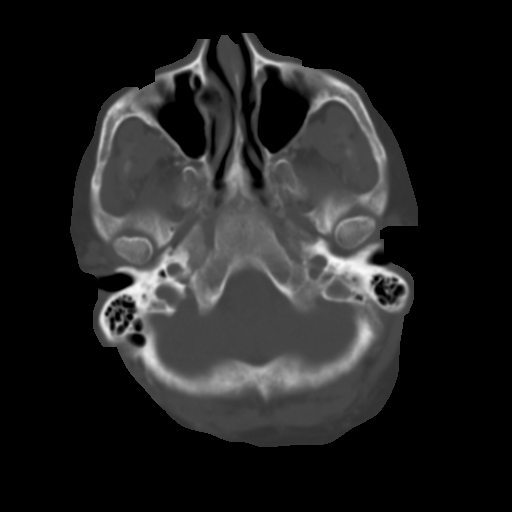
[im 6/30  brain]
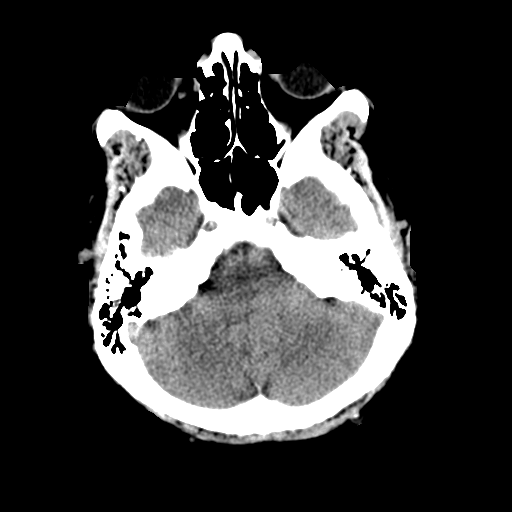
[im 9/30  brain]
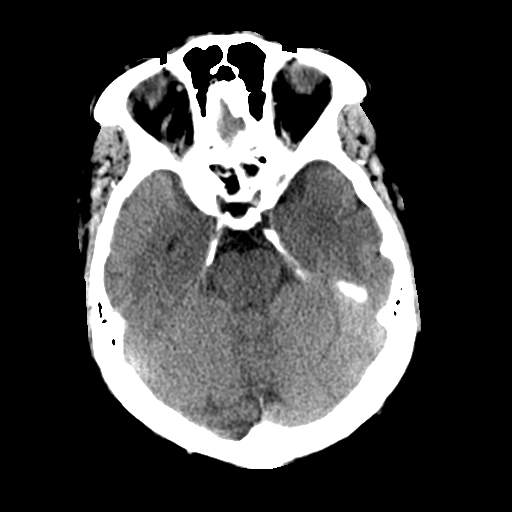
[im 12/30  brain]
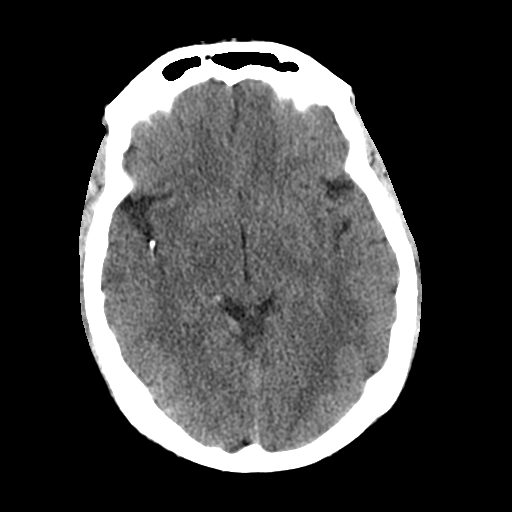
[im 16/30  brain]
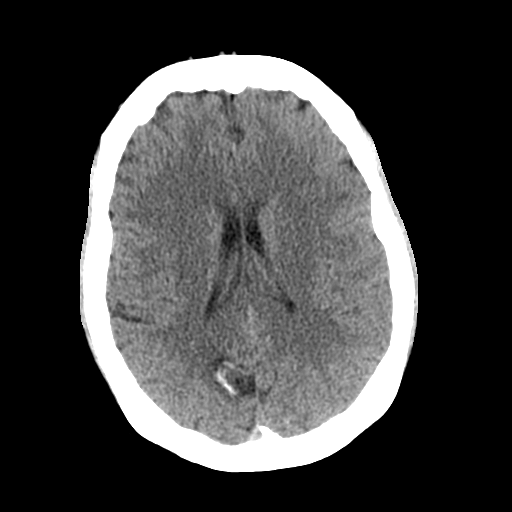
[im 16/30  bone]
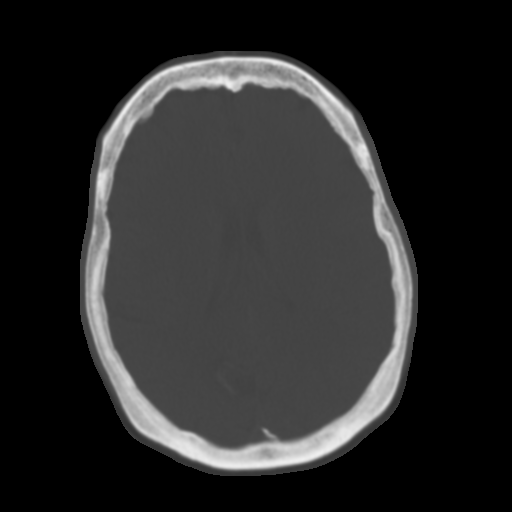
[im 19/30  brain]
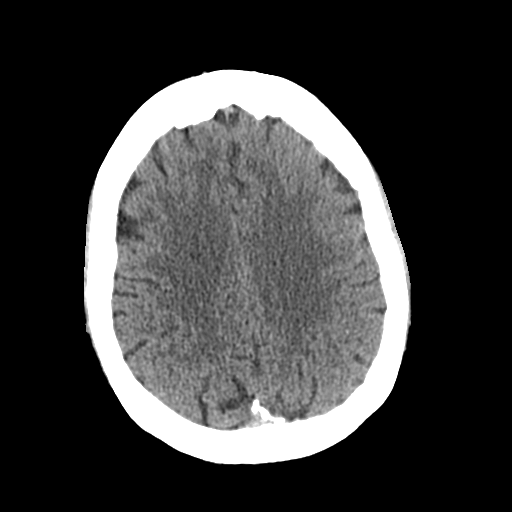
[im 22/30  brain]
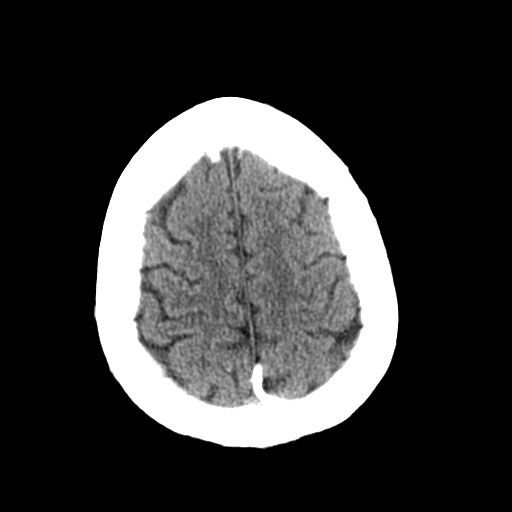
[im 25/30  brain]
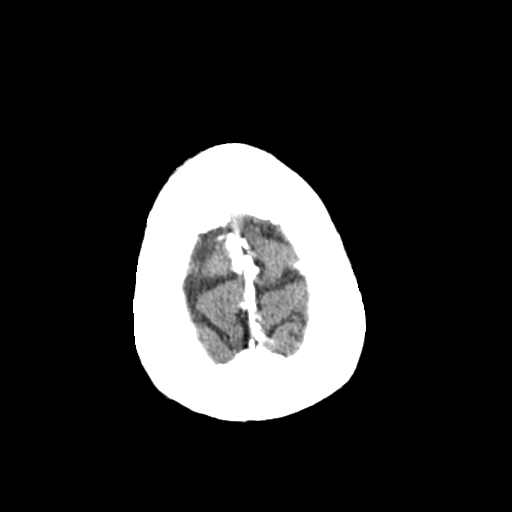
[im 28/30  brain]
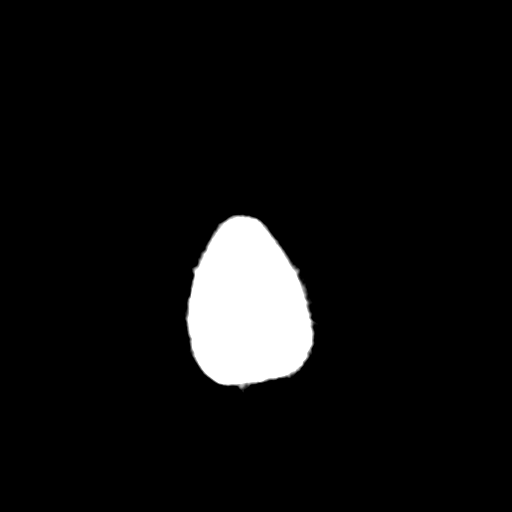
[im 28/30  bone]
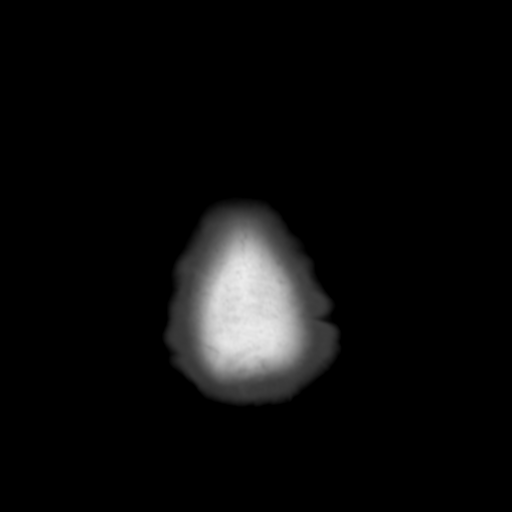

[Series 4: coronal soft tissue · coronal · 0.31mm/px · 3 of 66 slices shown]
[im 22/66  brain]
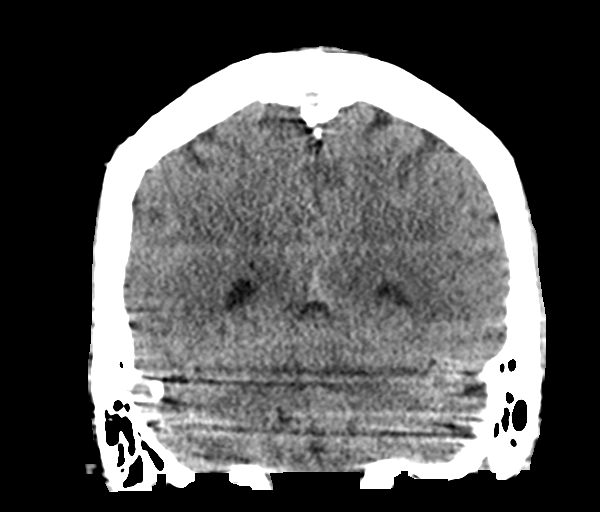
[im 29/66  brain]
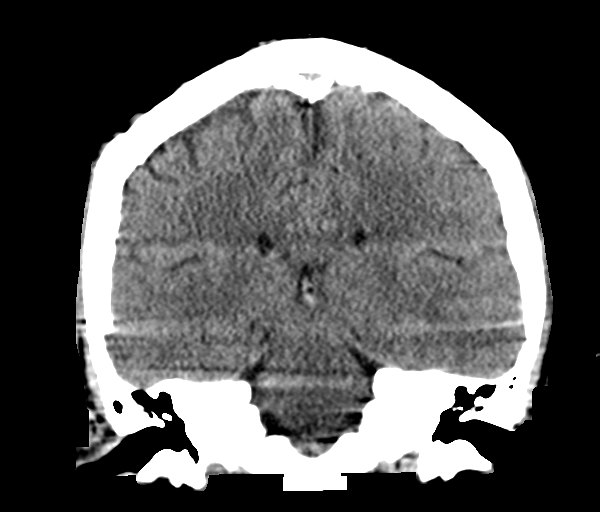
[im 37/66  brain]
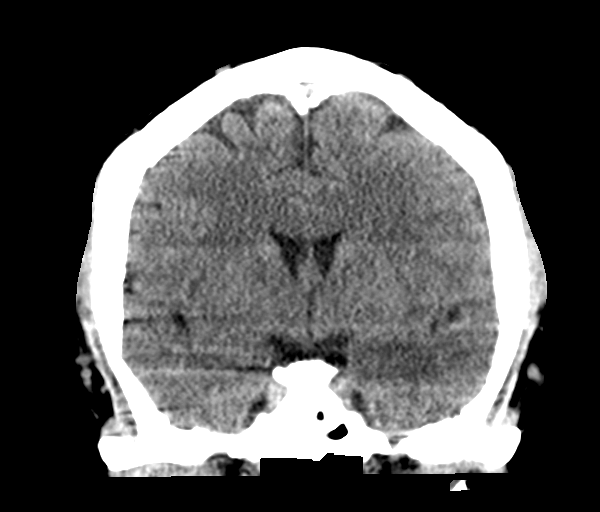

[Series 5: sagittal soft tissue · sagittal · 0.34mm/px · 3 of 55 slices shown]
[im 19/55  brain]
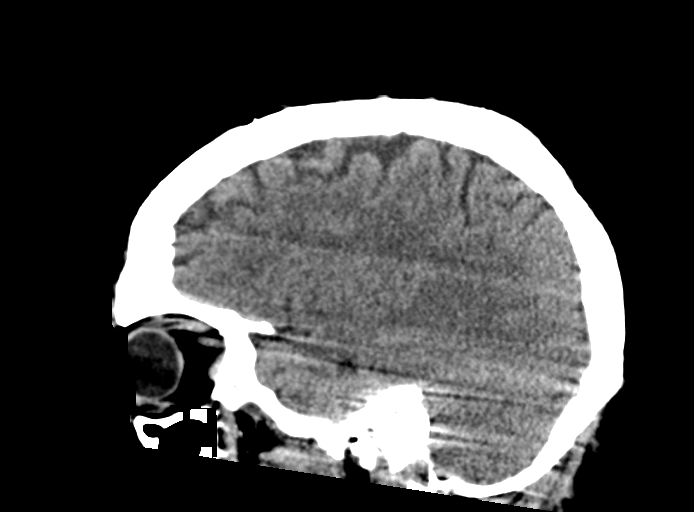
[im 28/55  brain]
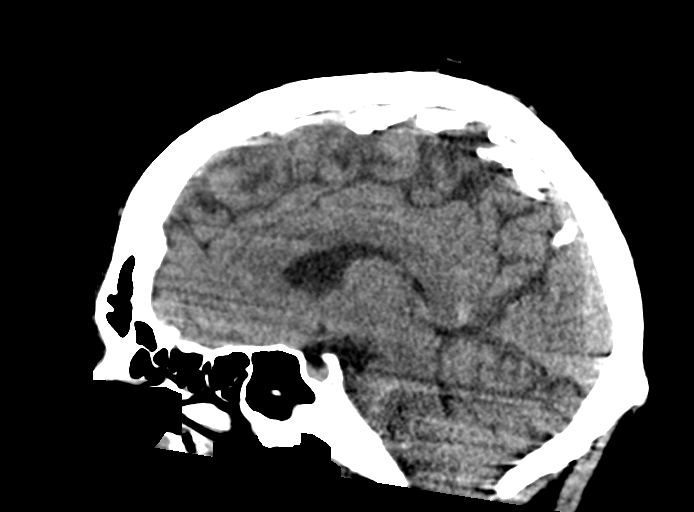
[im 37/55  brain]
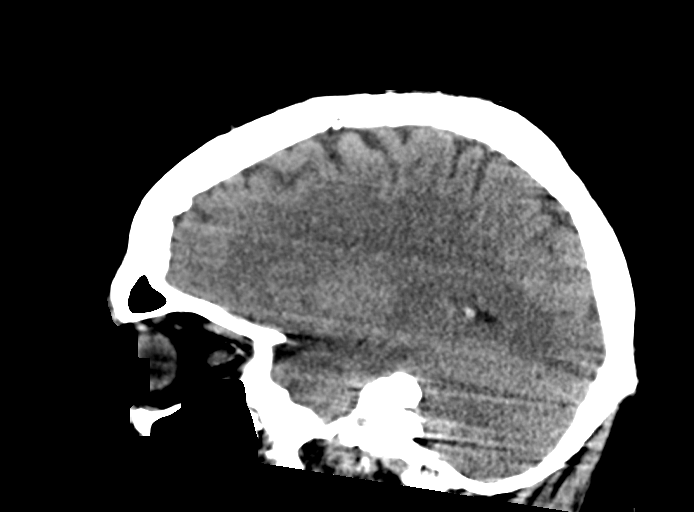

[15 of 47 positions shown; findings below may reference images not displayed]

FINDINGS: Brain: No evidence of acute infarction, hemorrhage, hydrocephalus,
extra-axial collection or mass lesion/mass effect. Chronic right
occipital lobe infarct is identified with laminar calcifications.
There is mild diffuse low-attenuation within the subcortical and
periventricular white matter compatible with chronic microvascular
disease.

Vascular: No hyperdense vessel or unexpected calcification.

Skull: Normal. Negative for fracture or focal lesion.

Sinuses/Orbits: No acute finding.

Other: None.
IMPRESSION: 1. No acute intracranial abnormalities.
2. Chronic small vessel ischemic change.
3. Chronic right occipital lobe infarct.
